# Patient Record
Sex: Male | Born: 1955 | Race: White | Hispanic: No
Health system: Southern US, Academic
[De-identification: ages and names within clinical notes are randomized; demographics above are authoritative.]

## PROBLEM LIST (undated history)

## (undated) ENCOUNTER — Encounter: Attending: Family | Primary: Family

## (undated) ENCOUNTER — Encounter: Attending: Hematology & Oncology | Primary: Hematology & Oncology

## (undated) ENCOUNTER — Ambulatory Visit: Payer: PRIVATE HEALTH INSURANCE | Attending: Family | Primary: Family

## (undated) ENCOUNTER — Telehealth: Attending: Family | Primary: Family

## (undated) ENCOUNTER — Encounter

## (undated) ENCOUNTER — Ambulatory Visit: Payer: PRIVATE HEALTH INSURANCE

## (undated) ENCOUNTER — Encounter: Attending: Physician Assistant | Primary: Physician Assistant

## (undated) ENCOUNTER — Ambulatory Visit

## (undated) ENCOUNTER — Telehealth

## (undated) ENCOUNTER — Telehealth: Attending: Hematology & Oncology | Primary: Hematology & Oncology

## (undated) ENCOUNTER — Telehealth: Attending: Physician Assistant | Primary: Physician Assistant

## (undated) ENCOUNTER — Ambulatory Visit: Payer: PRIVATE HEALTH INSURANCE | Attending: Hematology & Oncology | Primary: Hematology & Oncology

## (undated) ENCOUNTER — Telehealth: Attending: Urology | Primary: Urology

## (undated) ENCOUNTER — Ambulatory Visit: Payer: BLUE CROSS/BLUE SHIELD | Attending: Family | Primary: Family

## (undated) ENCOUNTER — Ambulatory Visit: Attending: Urology | Primary: Urology

## (undated) ENCOUNTER — Encounter: Payer: PRIVATE HEALTH INSURANCE | Attending: Urology | Primary: Urology

## (undated) DIAGNOSIS — K529 Noninfective gastroenteritis and colitis, unspecified: Secondary | ICD-10-CM

## (undated) DIAGNOSIS — R972 Elevated prostate specific antigen [PSA]: Secondary | ICD-10-CM

## (undated) DIAGNOSIS — Z8719 Personal history of other diseases of the digestive system: Secondary | ICD-10-CM

## (undated) DIAGNOSIS — Z8711 Personal history of peptic ulcer disease: Secondary | ICD-10-CM

## (undated) HISTORY — PX: BACK SURGERY: SHX140

## (undated) HISTORY — DX: Personal history of other diseases of the digestive system: Z87.19

## (undated) HISTORY — DX: Personal history of peptic ulcer disease: Z87.11

## (undated) HISTORY — DX: Noninfective gastroenteritis and colitis, unspecified: K52.9

## (undated) HISTORY — PX: TOTAL HIP ARTHROPLASTY: SHX124

## (undated) HISTORY — DX: Elevated prostate specific antigen (PSA): R97.20

## (undated) HISTORY — PX: COLONOSCOPY: SHX174

## (undated) MED ORDER — MELATONIN 3 MG TABLET: Freq: Every evening | ORAL | 0 days

## (undated) MED ORDER — OMEPRAZOLE 20 MG CAPSULE,DELAYED RELEASE: Freq: Every day | ORAL | 0 days | PRN

---

## 2004-10-01 ENCOUNTER — Ambulatory Visit: Payer: Self-pay | Admitting: Family Medicine

## 2013-11-03 ENCOUNTER — Ambulatory Visit: Payer: Self-pay | Admitting: Family Medicine

## 2014-02-28 ENCOUNTER — Ambulatory Visit: Payer: Self-pay | Admitting: Orthopedic Surgery

## 2014-02-28 LAB — BASIC METABOLIC PANEL
Anion Gap: 6 — ABNORMAL LOW (ref 7–16)
BUN: 21 mg/dL — ABNORMAL HIGH (ref 7–18)
CALCIUM: 9.5 mg/dL (ref 8.5–10.1)
CREATININE: 1.21 mg/dL (ref 0.60–1.30)
Chloride: 100 mmol/L (ref 98–107)
Co2: 30 mmol/L (ref 21–32)
EGFR (African American): >60
EGFR (Non-African Amer.): >60
GLUCOSE: 90 mg/dL (ref 65–99)
Osmolality: 274 (ref 275–301)
POTASSIUM: 4.2 mmol/L (ref 3.5–5.1)
SODIUM: 136 mmol/L (ref 136–145)

## 2014-02-28 LAB — URINALYSIS, COMPLETE
Bacteria: NONE SEEN
Bilirubin,UR: NEGATIVE
Glucose,UR: NEGATIVE mg/dL (ref 0–75)
Hyaline Cast: 10
Leukocyte Esterase: NEGATIVE
Nitrite: NEGATIVE
Ph: 5 (ref 4.5–8.0)
Protein: 30
RBC,UR: 13 /HPF (ref 0–5)
Specific Gravity: 1.027 (ref 1.003–1.030)
WBC UR: 2 /HPF (ref 0–5)

## 2014-02-28 LAB — CBC
HCT: 38.4 % — ABNORMAL LOW (ref 40.0–52.0)
HGB: 13.2 g/dL (ref 13.0–18.0)
MCH: 30.6 pg (ref 26.0–34.0)
MCHC: 34.4 g/dL (ref 32.0–36.0)
MCV: 89 fL (ref 80–100)
Platelet: 238 10*3/uL (ref 150–440)
RBC: 4.32 10*6/uL — AB (ref 4.40–5.90)
RDW: 13.2 % (ref 11.5–14.5)
WBC: 5.3 10*3/uL (ref 3.8–10.6)

## 2014-02-28 LAB — SEDIMENTATION RATE: Erythrocyte Sed Rate: 13 mm/hr (ref 0–20)

## 2014-02-28 LAB — PROTIME-INR
INR: 1.1
PROTHROMBIN TIME: 13.9 s (ref 11.5–14.7)

## 2014-02-28 LAB — MRSA PCR SCREENING

## 2014-02-28 LAB — APTT: ACTIVATED PTT: 34.4 s (ref 23.6–35.9)

## 2014-03-07 ENCOUNTER — Inpatient Hospital Stay: Payer: Self-pay | Admitting: Orthopedic Surgery

## 2014-03-08 LAB — BASIC METABOLIC PANEL
Anion Gap: 5 — ABNORMAL LOW (ref 7–16)
BUN: 12 mg/dL (ref 7–18)
CHLORIDE: 103 mmol/L (ref 98–107)
CO2: 28 mmol/L (ref 21–32)
CREATININE: 1.18 mg/dL (ref 0.60–1.30)
Calcium, Total: 8.4 mg/dL — ABNORMAL LOW (ref 8.5–10.1)
EGFR (African American): >60
GLUCOSE: 93 mg/dL (ref 65–99)
Osmolality: 271 (ref 275–301)
POTASSIUM: 4 mmol/L (ref 3.5–5.1)
Sodium: 136 mmol/L (ref 136–145)

## 2014-03-08 LAB — HEMOGLOBIN: HGB: 11.2 g/dL — AB (ref 13.0–18.0)

## 2014-03-08 LAB — PLATELET COUNT: Platelet: 185 10*3/uL (ref 150–440)

## 2014-03-09 LAB — PATHOLOGY REPORT

## 2014-12-25 DIAGNOSIS — K5190 Ulcerative colitis, unspecified, without complications: Secondary | ICD-10-CM | POA: Insufficient documentation

## 2015-01-22 ENCOUNTER — Ambulatory Visit: Payer: Self-pay | Admitting: Gastroenterology

## 2015-01-22 LAB — CLOSTRIDIUM DIFFICILE(ARMC)

## 2015-01-24 LAB — STOOL CULTURE

## 2015-02-05 ENCOUNTER — Ambulatory Visit: Payer: Self-pay | Admitting: Gastroenterology

## 2015-04-13 NOTE — Discharge Summary (Signed)
PATIENT NAME:  Michael Mccullough, HAMRE MR#:  [id number] DATE OF BIRTH:  [birth year]  DATE OF ADMISSION:  03/07/2014 DATE OF DISCHARGE:  03/09/2014  ADMITTING DIAGNOSIS: Right hip degenerative arthritis.   DISCHARGE DIAGNOSIS: Right hip degenerative arthritis.   OPERATION: On 03/07/2014, he had a right total hip arthroplasty.   SURGEON: Dr. Hessie Knows.   ANESTHESIA: Spinal.   ESTIMATED BLOOD LOSS: 200 mL.   DRAINS: Hemovac.   COMPLICATIONS: None.   IMPLANTS: Medacta Versa fit cup DM 54 mm, M28 head, with liner, 4 Amis stem.   HISTORY: Mr. Michael Mccullough is a 59 year old male, who failed conservative measures in treatment for his right hip osteoarthritis. He agreed to undergo a right total hip arthroplasty.   PHYSICAL EXAMINATION:  GENERAL: Well-developed, well-nourished. No apparent distress.  RESPIRATORY: No use of accessory muscles.  CARDIOVASCULAR: No lower extremity edema. No nausea or vomiting. Constipated. Tolerating diet. Incision well approximated. No drainage. Staples intact. Dressing clean and intact. Neurovascularly intact to right lower extremity. 3 over 5 straight leg raise, ankle dorsiflexion, and plantarflexion 5 over 5,  pain with hip flexion. Foley draining urine. TEDs and AVIs are on.   HOSPITAL COURSE: After initial admission on 03/07/2014, the patient underwent surgery the same day. He had good pain control afterwards and was transported to the orthopedic floor. On postoperative day 1, 03/08/2014, hemoglobin was 11.2. Physical therapy was begun on that day and he did progress with physical therapy. On postoperative day 2, 03/09/2014, he continued to progress well with physical therapy and did feel that he was able to be discharged on this day. Dressing was changed and his physical therapy goals were met.   DISPOSITION: The patient was sent home with home health physical therapy.   CONDITION AT DISCHARGE: Stable.   DISCHARGE INSTRUCTIONS: The patient will follow up in [facility] orthopedics in  2 weeks for staple removal. He will do physical therapy and weight bear as tolerated on the right lower extremity. A regular diet. TED hose knee-high bilaterally. Dressing can be changed once daily on an as-needed basis. Please see discharge instructions for a complete list of discharge medications.  ____________________________ Tajai Suder M. Tretha Sciara, NP amb:aw D: 03/28/2014 08:14:24 ET T: 03/28/2014 69:62:95 ET JOB#: [id number]  cc: Nasim Habeeb M. Tretha Sciara, NP, <Dictator> Kem Kays Raileigh Sabater FNP ELECTRONICALLY SIGNED 04/20/2014 14:34

## 2015-04-13 NOTE — Op Note (Signed)
PATIENT NAME:  Michael Mccullough, HUTMACHER MR#:  [id number] DATE OF BIRTH:  [birth year]  DATE OF PROCEDURE:  03/07/2014  PREOPERATIVE DIAGNOSIS: Right hip severe osteoarthritis.   POSTOPERATIVE DIAGNOSIS: Right hip severe osteoarthritis.   PROCEDURE: Right total hip replacement, anterior approach.   ANESTHESIA: Spinal.   SURGEON: Hessie Knows, M.D.   ASSISTANT: Rachelle Hora, PA-C.   DESCRIPTION OF PROCEDURE: The patient was brought to the operating room and after adequate anesthesia was obtained, the patient was placed on the operative table, left leg on a well-padded table, right leg in the traction boot. Preop x-ray was taken of the hip for templating subsequently after prepping and draping in the usual sterile fashion,  patient identification and timeout procedures were completed. Direct anterior approach was made with the incision centered over the greater trochanter. The tensor fascia muscle was identified and muscle belly opened and retracted laterally. Deep fascia was then incised and the lateral femoral circumflex vessels ligated. The anterior capsule was then opened and the femoral neck cut carried out with the head removed. The head showed findings of severe degenerative change with no cartilage and a mushroom appearance with osteophytes. The acetabulum also had sclerotic bone. The labrum was excised and sequential reaming was carried up to 54 mm where there was good bleeding bone. At this point, a 54 mm trial fit well and a 54 mm cup was impacted into place. Attention was then turned to the femur.   With the leg externally rotated and the leg dropped, sequential broaching was carried out up to a size 4. With a 4, trials were carried out and the final implant decision made. A size 4 Amis stem was impacted down the canal. It appeared to have a good proximal fill with a M28 mm head and the 54 mm Versafit cup DM liner. These components were assembled, impacted and the hip was reduced. There was good  stability to 90 degree external rotation test. The wound was thoroughly irrigated. The deep fascia was repaired using a heavy quill suture after infiltration of 30 mL of 0.25% Sensorcaine with epinephrine in the periarticular tissue. Aa subcutaneous drain was placed. A 2-0 quill was used subcutaneously, followed by skin staples. Xeroform, 4 x 4's, ABD and tape applied. The patient was sent to the recovery room in stable condition.   ESTIMATED BLOOD LOSS: 200 mL.   COMPLICATIONS: None.   SPECIMEN: Removed femoral head.   IMPLANTS: Medacta Amis stem size 4, 54 mm Versafit cup DM with associated liner and M28 mm head.   CONDITION: To recovery room, stable.    ____________________________ Laurene Footman, MD mjm:aw D: 03/07/2014 09:41:03 ET T: 03/07/2014 11:40:42 ET JOB#: [id number]  cc: Laurene Footman, MD, <Dictator> Laurene Footman MD ELECTRONICALLY SIGNED 03/07/2014 14:34

## 2015-04-15 LAB — SURGICAL PATHOLOGY

## 2015-08-30 ENCOUNTER — Ambulatory Visit (INDEPENDENT_AMBULATORY_CARE_PROVIDER_SITE_OTHER): Payer: BLUE CROSS/BLUE SHIELD | Admitting: Urology

## 2015-08-30 ENCOUNTER — Encounter: Payer: Self-pay | Admitting: Urology

## 2015-08-30 VITALS — BP 113/73 | HR 81 | Ht 67.0 in | Wt 185.6 lb

## 2015-08-30 DIAGNOSIS — R972 Elevated prostate specific antigen [PSA]: Secondary | ICD-10-CM

## 2015-08-30 DIAGNOSIS — N402 Nodular prostate without lower urinary tract symptoms: Secondary | ICD-10-CM | POA: Diagnosis not present

## 2015-08-30 MED ORDER — CIPROFLOXACIN HCL 500 MG PO TABS: 500.0000 mg | ORAL_TABLET | Freq: Two times a day (BID) | ORAL | Status: AC

## 2015-08-30 NOTE — Progress Notes (Signed)
08/30/2015 3:36 PM   Domenick Bookbinder 10/25/1956 [id number]  Referring provider: No referring provider defined for this encounter.  Chief Complaint  Patient presents with  . Elevated PSA    referred by Dr[facility] [place] psa 15.24    HPI: Mr Mossa is a 59yo with a hx of elevated PSA is seen in consultation today for an elevated PSA. He is b the [facility] for a recent PSA of 15.2. He was previously seen by urology several years ago for an elevated PSA and it was recommended he have a prostate biopsy. He never followed up and now presents today for evaluation. No hx of prostate infections.  He denies any LUTS, he denies hematuria.  No difficulty with erections His father and brother had prostate cancer and both are deceased. Neither died from prostate cancer.     PMH: Past Medical History  Diagnosis Date  . History of stomach ulcers   . Colitis   . Elevated PSA     Surgical History: Past Surgical History  Procedure Laterality Date  . Total hip arthroplasty Right   . Colonoscopy      Home Medications:    Medication List       This list is accurate as of: 08/30/15  3:36 PM.  Always use your most recent med list.               acetaminophen 325 MG tablet  Commonly known as:  TYLENOL  Take 650 mg by mouth every 6 (six) hours as needed.     Mesalamine 800 MG Tbec  Take by mouth.     omeprazole 20 MG capsule  Commonly known as:  PRILOSEC  TAKE 1 TABLET (20 MG TOTAL) BY MOUTH AS NEEDED.        Allergies:  Allergies  Allergen Reactions  . Aspirin Other (See Comments)    GI UPSET.    Family History: Family History  Problem Relation Age of Onset  . Kidney disease    . Prostate cancer Father   . Prostate cancer Brother     Social History:  reports that he has never smoked. He does not have any smokeless tobacco history on file. He reports that he drinks alcohol. He reports that he does not use illicit  drugs.  ROS: UROLOGY Frequent Urination?: No Hard to postpone urination?: No Burning/pain with urination?: No Get up at night to urinate?: No Leakage of urine?: No Urine stream starts and stops?: No Trouble starting stream?: No Do you have to strain to urinate?: No Blood in urine?: No Urinary tract infection?: No Sexually transmitted disease?: No Injury to kidneys or bladder?: No Painful intercourse?: No Weak stream?: No Erection problems?: No Penile pain?: No  Gastrointestinal Nausea?: No Vomiting?: No Indigestion/heartburn?: No Diarrhea?: No Constipation?: No  Constitutional Fever: No Night sweats?: No Weight loss?: No Fatigue?: No  Skin Skin rash/lesions?: No Itching?: No  Eyes Blurred vision?: No Double vision?: No  Ears/Nose/Throat Sore throat?: No Sinus problems?: No  Hematologic/Lymphatic Swollen glands?: No Easy bruising?: No  Cardiovascular Leg swelling?: No Chest pain?: No  Respiratory Cough?: No Shortness of breath?: No  Endocrine Excessive thirst?: No  Musculoskeletal Back pain?: Yes Joint pain?: Yes  Neurological Headaches?: No Dizziness?: No  Psychologic Depression?: No Anxiety?: No  Physical Exam: BP 113/73 mmHg  Pulse 81  Ht 5\' 7"  (1.702 m)  Wt 84.188 kg (185 lb 9.6 oz)  BMI 29.06 kg/m2  Constitutional:  Alert  and oriented, No acute distress. HEENT: [place] AT, moist mucus membranes.  Trachea midline, no masses. Cardiovascular: No clubbing, cyanosis, or edema. Respiratory: Normal respiratory effort, no increased work of breathing. GI: Abdomen is soft, nontender, nondistended, no abdominal masses GU: No CVA tenderness. No masses/lesions on penis, testis, scrotum. Prostate 30 g firm right assymetry. Perineum normal Skin: No rashes, bruises or suspicious lesions. Lymph: No cervical or inguinal adenopathy. Neurologic: Grossly intact, no focal deficits, moving all 4 extremities. Psychiatric: Normal mood and  affect.  Laboratory Data: Lab Results  Component Value Date   WBC 5.3 02/28/2014   HGB 11.2* 03/08/2014   HCT 38.4* 02/28/2014   MCV 89 02/28/2014   PLT 185 03/08/2014    Lab Results  Component Value Date   CREATININE 1.18 03/08/2014    No results found for: PSA  No results found for: TESTOSTERONE  No results found for: HGBA1C  Urinalysis    Component Value Date/Time   COLORURINE Amber 02/28/2014 0955   APPEARANCEUR Hazy 02/28/2014 0955   LABSPEC 1.027 02/28/2014 0955   PHURINE 5.0 02/28/2014 0955   GLUCOSEU Negative 02/28/2014 0955   HGBUR 1+ 02/28/2014 0955   BILIRUBINUR Negative 02/28/2014 0955   KETONESUR Trace 02/28/2014 0955   PROTEINUR 30 mg/dL 02/28/2014 0955   NITRITE Negative 02/28/2014 0955   LEUKOCYTESUR Negative 02/28/2014 0955     Assessment & Plan:   1. Elevated PSA,  nodular prostate  We discussed the possible etiologies of elevated PSA including benign conditions such as  BPH, inflammation, infection as well as malignant conditions most commonly  adenocarcinoma and re-iterated that elevated PSA did not equate to a cancer diagnosis.  We then discussed possible management strategies including prostate biopsy, recheck  after course of antibiotics, or serial PSA's (to establish clear trends, doubling time, etc..),  with attendant risks and benefits taking into consideration his overall health status, family  history, and life expectancy.  The patient has chosen to proceed with biopsy. We then discussed the risks (pain,  invasive procedure, bleeding, false positive/negative, serious / life-threatening infection)  and benefits (reassurance, better confirmation of diagnosis) as well as the fact that  additional biopsies or imaging may be recommended at a later time if future PSA values  are worrisome. We stressed the importance of adherence to the enemas and antibiotics as  prescribed as well as the possibility of day-of- procedure IM  antibiotic shot.  There are no diagnoses linked to this encounter.  No Follow-up on file.  Cleon Gustin, [facility] [address] [place], [place] [address] [phone]

## 2015-09-09 ENCOUNTER — Encounter: Payer: Self-pay | Admitting: *Deleted

## 2015-09-10 ENCOUNTER — Ambulatory Visit: Payer: BLUE CROSS/BLUE SHIELD | Admitting: Anesthesiology

## 2015-09-10 ENCOUNTER — Encounter
Admission: RE | Disposition: A | Discharge status: Home / Self Care | Payer: Self-pay | Source: Ambulatory Visit | Attending: Gastroenterology

## 2015-09-10 ENCOUNTER — Ambulatory Visit
Admission: RE | Admit: 2015-09-10 | Discharge: 2015-09-10 | Disposition: A | Discharge status: Home / Self Care | Payer: BLUE CROSS/BLUE SHIELD | Source: Ambulatory Visit | Attending: Gastroenterology | Admitting: Gastroenterology

## 2015-09-10 DIAGNOSIS — K5150 Left sided colitis without complications: Secondary | ICD-10-CM | POA: Diagnosis not present

## 2015-09-10 DIAGNOSIS — K648 Other hemorrhoids: Secondary | ICD-10-CM | POA: Insufficient documentation

## 2015-09-10 DIAGNOSIS — K5190 Ulcerative colitis, unspecified, without complications: Secondary | ICD-10-CM | POA: Diagnosis present

## 2015-09-10 DIAGNOSIS — Z886 Allergy status to analgesic agent status: Secondary | ICD-10-CM | POA: Diagnosis not present

## 2015-09-10 HISTORY — PX: COLONOSCOPY WITH PROPOFOL: SHX5780

## 2015-09-10 SURGERY — COLONOSCOPY WITH PROPOFOL
Anesthesia: General

## 2015-09-10 MED ORDER — SODIUM CHLORIDE 0.9 % IV SOLN
INTRAVENOUS | Status: DC
Start: 2015-09-10 — End: 2015-09-10

## 2015-09-10 MED ORDER — MIDAZOLAM HCL 5 MG/5ML IJ SOLN
INTRAMUSCULAR | Status: DC | PRN
  Administered 2015-09-10 (×2): 1 mg via INTRAVENOUS

## 2015-09-10 MED ORDER — SODIUM CHLORIDE 0.9 % IV SOLN
INTRAVENOUS | Status: DC
Start: 2015-09-10 — End: 2015-09-10
  Administered 2015-09-10: 16:00:00 via INTRAVENOUS
  Administered 2015-09-10: 1000 mL via INTRAVENOUS

## 2015-09-10 MED ORDER — FENTANYL CITRATE (PF) 100 MCG/2ML IJ SOLN
INTRAMUSCULAR | Status: DC | PRN
  Administered 2015-09-10: 50 ug via INTRAVENOUS

## 2015-09-10 MED ORDER — AMPICILLIN SODIUM 1 G IJ SOLR
1.0000 g | Freq: Once | INTRAMUSCULAR | Status: AC
  Administered 2015-09-10: 1 g via INTRAVENOUS
  Filled 2015-09-10: qty 1000

## 2015-09-10 MED ORDER — EPHEDRINE SULFATE 50 MG/ML IJ SOLN
INTRAMUSCULAR | Status: DC | PRN
  Administered 2015-09-10 (×2): 5 mg via INTRAVENOUS
  Administered 2015-09-10: 10 mg via INTRAVENOUS
  Administered 2015-09-10: 5 mg via INTRAVENOUS

## 2015-09-10 MED ORDER — LIDOCAINE HCL (CARDIAC) 20 MG/ML IV SOLN
INTRAVENOUS | Status: DC | PRN
  Administered 2015-09-10: 40 mg via INTRAVENOUS

## 2015-09-10 MED ORDER — PROPOFOL 10 MG/ML IV BOLUS
INTRAVENOUS | Status: DC | PRN
  Administered 2015-09-10: 40 mg via INTRAVENOUS

## 2015-09-10 MED ORDER — PROPOFOL INFUSION 10 MG/ML OPTIME
INTRAVENOUS | Status: DC | PRN
  Administered 2015-09-10: 140 ug/kg/min via INTRAVENOUS

## 2015-09-10 NOTE — Anesthesia Preprocedure Evaluation (Addendum)
Anesthesia Evaluation  Patient identified by MRN, date of birth, ID band Patient awake    Reviewed: Allergy & Precautions, NPO status , Patient's Chart, lab work & pertinent test results, reviewed documented beta blocker date and time   Airway Mallampati: II  TM Distance: >3 FB     Dental  (+) Chipped, Missing   Pulmonary           Cardiovascular      Neuro/Psych    GI/Hepatic   Endo/Other    Renal/GU      Musculoskeletal   Abdominal   Peds  Hematology   Anesthesia Other Findings Hx of ulcers.Several missing teeth.  Reproductive/Obstetrics                            Anesthesia Physical Anesthesia Plan  ASA: III  Anesthesia Plan: General   Post-op Pain Management:    Induction: Intravenous  Airway Management Planned: Nasal Cannula  Additional Equipment:   Intra-op Plan:   Post-operative Plan:   Informed Consent: I have reviewed the patients History and Physical, chart, labs and discussed the procedure including the risks, benefits and alternatives for the proposed anesthesia with the patient or authorized representative who has indicated his/her understanding and acceptance.     Plan Discussed with: CRNA  Anesthesia Plan Comments:         Anesthesia Quick Evaluation

## 2015-09-10 NOTE — Anesthesia Procedure Notes (Signed)
Date/Time: 09/10/2015 3:55 PM Performed by: Doreen Salvage Pre-anesthesia Checklist: Patient identified, Emergency Drugs available, Suction available and Patient being monitored Patient Re-evaluated:Patient Re-evaluated prior to inductionOxygen Delivery Method: Nasal cannula

## 2015-09-10 NOTE — H&P (Signed)
Outpatient short stay form Pre-procedure 09/10/2015 2:57 PM Lollie Sails MD  Primary Physician: Dr Sherrin Daisy  Reason for visit:  Colonoscopy  History of present illness:  Follow-up ulcerative colitis. Patient is a 59 year old male who last seen for colonoscopy in about February. That time showed evidence of a left-sided ulcerative colitis. As then he has been on a 5-ASA treatment and has been doing well. He has formed stools currently. No abdominal pain at this point.    Current facility-administered medications:  .  0.9 %  sodium chloride infusion, , Intravenous, Continuous, Lollie Sails, MD, Last Rate: 20 mL/hr at 09/10/15 1350, 1,000 mL at 09/10/15 1350 .  0.9 %  sodium chloride infusion, , Intravenous, Continuous, Lollie Sails, MD  Prescriptions prior to admission  Medication Sig Dispense Refill Last Dose  . acetaminophen (TYLENOL) 325 MG tablet Take 650 mg by mouth every 6 (six) hours as needed.     . ciprofloxacin (CIPRO) 500 MG tablet Take 1 tablet (500 mg total) by mouth 2 (two) times daily. 6 tablet 0   . Mesalamine 800 MG TBEC Take by mouth.   Taking  . omeprazole (PRILOSEC) 20 MG capsule TAKE 1 TABLET (20 MG TOTAL) BY MOUTH AS NEEDED.   Taking     Allergies  Allergen Reactions  . Aspirin Other (See Comments)    GI UPSET.     Past Medical History  Diagnosis Date  . History of stomach ulcers   . Colitis   . Elevated PSA     Review of systems:      Physical Exam    Heart and lungs: Regular rate and rhythm without rub or gallop, lungs are bilaterally clear.    HEENT: Norm cephalic atraumatic eyes are anicteric    Other:   Pertinant exam for procedure: Soft nontender nondistended bowel sounds positive normoactive    Planned proceedures: Colonoscopy and indicated procedures. I have discussed the risks benefits and complications of procedures to include not limited to bleeding, infection, perforation and the risk of sedation and the patient  wishes to proceed.    Lollie Sails, MD Gastroenterology 09/10/2015  2:57 PM

## 2015-09-10 NOTE — Op Note (Signed)
[facility] Gastroenterology Patient Name: Michael Mccullough Procedure Date: 09/10/2015 3:43 PM MRN: [id number] Account #: [id number] Date of Birth: 1956-03-25 Admit Type: Outpatient Age: 59 Room: [facility] ENDO ROOM 3 Gender: Male Note Status: Finalized Procedure:         Colonoscopy Indications:       Personal history of ulcerative colitis Providers:         Lollie Sails, MD Referring MD:      Shirline Frees (Referring MD) Medicines:         Monitored Anesthesia Care Complications:     No immediate complications. Procedure:         Pre-Anesthesia Assessment:                    - ASA Grade Assessment: III - A patient with severe                     systemic disease.                    After obtaining informed consent, the colonoscope was                     passed under direct vision. Throughout the procedure, the                     patient's blood pressure, pulse, and oxygen saturations                     were monitored continuously. The Colonoscope was                     introduced through the anus and advanced to the the cecum,                     identified by appendiceal orifice and ileocecal valve. The                     colonoscopy was performed without difficulty. The quality                     of the bowel preparation was good. Findings:      Diffuse mild inflammation characterized by congestion (edema), erythema       and friability was found in the rectum and in the sigmoid colon.      The exam was otherwise without abnormality.      The digital rectal exam was normal.      Non-bleeding internal hemorrhoids were found during anoscopy. The       hemorrhoids were small.      Biopsies for histology were taken with a cold forceps from the cecum,       ascending colon, transverse colon, descending colon, sigmoid colon and       rectum for evaluation of microscopic colitis.      a small mucosal plaque is seen in the mid rectum about 1/5 cm in size,      removed with a cold forcep and placed in a separate jar. Impression:        - Diffuse mild inflammation was found in the rectum and in                     the sigmoid colon secondary to proctosigmoid ulcerative  colitis.                    - The examination was otherwise normal.                    - Non-bleeding internal hemorrhoids.                    - Biopsies were taken with a cold forceps from the cecum,                     ascending colon, transverse colon, descending colon,                     sigmoid colon and rectum for evaluation of microscopic                     colitis. Recommendation:    - Await pathology results.                    - Use Asacol 800 mg PO TID.                    - Return to GI office in 4 weeks. 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 All rights reserved. The codes documented in this report are preliminary and upon coder review may  be revised to meet current compliance requirements. Lollie Sails, MD 09/10/2015 4:44:48 PM This report has been signed electronically. Number of Addenda: 0 Note Initiated On: 09/10/2015 3:43 PM Scope Withdrawal Time: 0 hours 20 minutes 56 seconds  Total Procedure Duration: 0 hours 30 minutes 47 seconds       [facility]

## 2015-09-10 NOTE — Transfer of Care (Signed)
Immediate Anesthesia Transfer of Care Note  Patient: Michael Mccullough  Procedure(s) Performed: Procedure(s): COLONOSCOPY WITH PROPOFOL (N/A)  Patient Location: PACU and Endoscopy Unit  Anesthesia Type:General  Level of Consciousness: sedated  Airway & Oxygen Therapy: Patient Spontanous Breathing and Patient connected to nasal cannula oxygen  Post-op Assessment: Report given to RN and Post -op Vital signs reviewed and stable  Post vital signs: Reviewed and stable  Last Vitals:  Filed Vitals:   09/10/15 1648  BP: 121/60  Pulse:   Temp: 36.1 C  Resp: 18    Complications: No apparent anesthesia complications

## 2015-09-11 ENCOUNTER — Encounter: Payer: Self-pay | Admitting: Gastroenterology

## 2015-09-11 NOTE — Anesthesia Postprocedure Evaluation (Signed)
  Anesthesia Post-op Note  Patient: Michael Mccullough  Procedure(s) Performed: Procedure(s): COLONOSCOPY WITH PROPOFOL (N/A)  Anesthesia type:General  Patient location: PACU  Post pain: Pain level controlled  Post assessment: Post-op Vital signs reviewed, Patient's Cardiovascular Status Stable, Respiratory Function Stable, Patent Airway and No signs of Nausea or vomiting  Post vital signs: Reviewed and stable  Last Vitals:  Filed Vitals:   09/10/15 1718  BP: 129/68  Pulse: 70  Temp:   Resp: 13    Level of consciousness: awake, alert  and patient cooperative  Complications: No apparent anesthesia complications

## 2015-09-12 LAB — SURGICAL PATHOLOGY

## 2015-09-23 ENCOUNTER — Other Ambulatory Visit: Payer: BLUE CROSS/BLUE SHIELD | Admitting: Urology

## 2015-09-23 ENCOUNTER — Encounter: Payer: Self-pay | Admitting: Urology

## 2015-10-01 ENCOUNTER — Ambulatory Visit: Payer: BLUE CROSS/BLUE SHIELD

## 2015-10-15 ENCOUNTER — Ambulatory Visit (INDEPENDENT_AMBULATORY_CARE_PROVIDER_SITE_OTHER): Payer: BLUE CROSS/BLUE SHIELD | Admitting: Urology

## 2015-10-15 ENCOUNTER — Other Ambulatory Visit: Payer: Self-pay | Admitting: Urology

## 2015-10-15 ENCOUNTER — Encounter: Payer: Self-pay | Admitting: Urology

## 2015-10-15 VITALS — BP 119/78 | HR 68 | Ht 67.0 in | Wt 178.2 lb

## 2015-10-15 DIAGNOSIS — R972 Elevated prostate specific antigen [PSA]: Secondary | ICD-10-CM | POA: Diagnosis not present

## 2015-10-15 MED ORDER — GENTAMICIN SULFATE 40 MG/ML IJ SOLN
80.0000 mg | Freq: Once | INTRAMUSCULAR | Status: AC
  Administered 2015-10-15: 80 mg via INTRAMUSCULAR

## 2015-10-15 MED ORDER — LEVOFLOXACIN 500 MG PO TABS: 500.0000 mg | ORAL_TABLET | Freq: Once | ORAL | Status: DC

## 2015-10-15 NOTE — Procedures (Signed)
Prostate Biopsy Procedure   Informed consent was obtained after discussing risks/benefits of the procedure.  A time out was performed to ensure correct patient identity.  Pre-Procedure: - Last PSA Level: No results found for: PSA - Gentamicin given prophylactically - Bactrim DS BID for three days peri-procedure -Transrectal Ultrasound performed revealing a 27.5 gm prostate -No significant hypoechoic or median lobe noted  Procedure: - Prostate block performed using 10 cc 1% lidocaine and biopsies taken from sextant areas, a total of 12 under ultrasound guidance.  Post-Procedure: - Patient tolerated the procedure well - He was counseled to seek immediate medical attention if experiences any severe pain, significant bleeding, or fevers - Return in one week to discuss biopsy results

## 2015-10-19 LAB — PATHOLOGY REPORT

## 2015-10-24 ENCOUNTER — Other Ambulatory Visit: Payer: Self-pay | Admitting: Urology

## 2015-10-24 ENCOUNTER — Telehealth: Payer: Self-pay

## 2015-10-24 ENCOUNTER — Ambulatory Visit (INDEPENDENT_AMBULATORY_CARE_PROVIDER_SITE_OTHER): Payer: BLUE CROSS/BLUE SHIELD | Admitting: Urology

## 2015-10-24 VITALS — BP 125/61 | HR 69 | Ht 67.0 in | Wt 181.9 lb

## 2015-10-24 DIAGNOSIS — C61 Malignant neoplasm of prostate: Secondary | ICD-10-CM

## 2015-10-24 NOTE — Telephone Encounter (Signed)
-----   Message from Ardis Hughs, MD sent at 10/23/2015  4:58 PM EDT ----- Regarding: Biopsy results Please call this patient and let him know that his prostate biopsy was negative.  Then, please schedule him for follow-up in 6 months with a PSA had of time. Thank you

## 2015-10-24 NOTE — Telephone Encounter (Signed)
LMOM

## 2015-10-24 NOTE — Telephone Encounter (Signed)
Pt has an appt today to get prostate bx results. Results will be given then.

## 2015-10-24 NOTE — Progress Notes (Signed)
10/24/2015 12:08 PM   Michael Mccullough Nov 19, 1956 [id number]  Referring provider: Sherrin Daisy, MD [place], [place] [address]  CC: Prostate Cancer  HPI: The patient returns to discuss his prostate biopsy results which were positive.  Also note, the patient has good incontinence and and has erections capable penetration.   PMH: Past Medical History  Diagnosis Date  . History of stomach ulcers   . Colitis   . Elevated PSA     Surgical History: Past Surgical History  Procedure Laterality Date  . Total hip arthroplasty Right   . Colonoscopy    . Back surgery    . Colonoscopy with propofol N/A 09/10/2015    Procedure: COLONOSCOPY WITH PROPOFOL;  Surgeon: Lollie Sails, MD;  Location: [facility] ENDOSCOPY;  Service: Endoscopy;  Laterality: N/A;    Home Medications:    Medication List       This list is accurate as of: 10/24/15 12:08 PM.  Always use your most recent med list.               acetaminophen 325 MG tablet  Commonly known as:  TYLENOL  Take 650 mg by mouth every 6 (six) hours as needed.     Mesalamine 800 MG Tbec  Take by mouth.     omeprazole 20 MG capsule  Commonly known as:  PRILOSEC  TAKE 1 TABLET (20 MG TOTAL) BY MOUTH AS NEEDED.        Allergies:  Allergies  Allergen Reactions  . Aspirin Other (See Comments)    GI UPSET.    Family History: Family History  Problem Relation Age of Onset  . Kidney disease    . Prostate cancer Father   . Prostate cancer Brother     Social History:  reports that he has never smoked. He does not have any smokeless tobacco history on file. He reports that he drinks alcohol. He reports that he does not use illicit drugs.  ROS:                                        Physical Exam: BP 125/61 mmHg  Pulse 69  Ht 5\' 7"  (1.702 m)  Wt 181 lb 14.4 oz (82.509 kg)  BMI 28.48 kg/m2  Constitutional:  Alert and oriented, No acute distress. HEENT: [place] AT, moist mucus  membranes.  Trachea midline, no masses. Cardiovascular: No clubbing, cyanosis, or edema. Respiratory: Normal respiratory effort, no increased work of breathing. GI: Abdomen is soft, nontender, nondistended, no abdominal masses GU: No CVA tenderness.  Skin: No rashes, bruises or suspicious lesions. Lymph: No cervical or inguinal adenopathy. Neurologic: Grossly intact, no focal deficits, moving all 4 extremities. Psychiatric: Normal mood and affect.  Laboratory Data: Lab Results  Component Value Date   WBC 5.3 02/28/2014   HGB 11.2* 03/08/2014   HCT 38.4* 02/28/2014   MCV 89 02/28/2014   PLT 185 03/08/2014    Lab Results  Component Value Date   CREATININE 1.18 03/08/2014    No results found for: PSA  No results found for: TESTOSTERONE  No results found for: HGBA1C  Urinalysis    Component Value Date/Time   COLORURINE Amber 02/28/2014 0955   APPEARANCEUR Hazy 02/28/2014 0955   LABSPEC 1.027 02/28/2014 0955   PHURINE 5.0 02/28/2014 0955   GLUCOSEU Negative 02/28/2014 0955   HGBUR 1+ 02/28/2014 0955   BILIRUBINUR Negative  02/28/2014 [place] 02/28/2014 0955   PROTEINUR 30 mg/dL 02/28/2014 0955   NITRITE Negative 02/28/2014 0955   LEUKOCYTESUR Negative 02/28/2014 0955    PSA: 15.24  Prostate biopsy: The patient has Gleason 3+3 = 6 prostate cancer in 8 of 12 cores. His entire right side is positive with 3 cores greater than 80% in additional core at 50%. His left lateral base and left mid also had bovine 3+3 = 6 cancer.   Assessment & Plan:  1. Prostate cancer The patient has high-volume Gleason 3+3 = 6 prostate cancer.  I had a long discussion with him regarding treatment options for him. We discussed watchful waiting, active surveillance, cryotherapy, radical prostatectomy, and radiation therapy. Due to his high volume disease and his age, I discussed with him that I don't think watchful waiting or active surveillance are great choices for him. We  discussed the role of robotic prostatectomy. He is aware that the risks include, but are not limited to, erectile dysfunction, impotence, incomplete cancer control, bleeding, infection, and repeat hospitalization. He is also aware that he would have to have a catheter postoperatively. We also discussed the role of radiation as a possible treatment modality and some of the risks of that approach. At this time he would like to think about his options. In the interim, we will have him get an opinion from the radiation oncologists. He'll follow-up after his radiation oncology appointment in a few weeks.   Return in about 3 weeks (around 11/14/2015).  Michael Retort, MD  [facility] [address], [place], [place] [address] [phone]

## 2015-10-30 ENCOUNTER — Ambulatory Visit: Payer: BLUE CROSS/BLUE SHIELD | Admitting: Radiation Oncology

## 2015-11-06 ENCOUNTER — Ambulatory Visit
Admission: RE | Admit: 2015-11-06 | Discharge: 2015-11-06 | Disposition: A | Discharge status: Home / Self Care | Payer: BLUE CROSS/BLUE SHIELD | Source: Ambulatory Visit | Attending: Radiation Oncology | Admitting: Radiation Oncology

## 2015-11-06 ENCOUNTER — Encounter: Payer: Self-pay | Admitting: Radiation Oncology

## 2015-11-06 ENCOUNTER — Encounter (INDEPENDENT_AMBULATORY_CARE_PROVIDER_SITE_OTHER): Payer: Self-pay

## 2015-11-06 VITALS — BP 110/64 | HR 84 | Temp 96.5°F | Resp 18 | Wt 170.6 lb

## 2015-11-06 DIAGNOSIS — C61 Malignant neoplasm of prostate: Secondary | ICD-10-CM

## 2015-11-06 NOTE — Consult Note (Signed)
Except an outstanding is perfect of Radiation Oncology NEW PATIENT EVALUATION  Name: Michael Mccullough  MRN: [id number]  Date:   11/06/2015     DOB: November 08, 1956   This 59 y.o. male patient presents to the clinic for initial evaluation of stage IIa prostate cancer Gleason 6 (3+3) bilateral disease presenting with a PSA in the 15 range.  REFERRING PHYSICIAN: Sherrin Daisy, MD  CHIEF COMPLAINT:  Chief Complaint  Patient presents with  . Prostate Cancer    Pt is here for initial consultation of prostate cancer.      DIAGNOSIS: The encounter diagnosis was Malignant neoplasm of prostate ([place]).   PREVIOUS INVESTIGATIONS:  Pathology report is reviewed Bone scan has been ordered Clinical notes reviewed  HPI: Patient is a 59 year old male presented with an elevated PSA in the 15 range. He was seen by urology underwent transrectal ultrasound-guided biopsies showing 8 of 12 biopsies positive for Gleason 6 (3+3) adenocarcinoma of the prostate. Patient is very little lower urinary tract symptoms. Nocturia 1 no urgency or frequency. He also has no erectile dysfunction and has good continence. He has been seen by urology and recommendations and discussion of radical prostatectomy were made. He seen today for radiation oncology opinion. He does have a history of total hip replacement and history of colitis.  PLANNED TREATMENT REGIMEN: I-125 interstitial implant  PAST MEDICAL HISTORY:  has a past medical history of History of stomach ulcers; Colitis; and Elevated PSA.    PAST SURGICAL HISTORY:  Past Surgical History  Procedure Laterality Date  . Total hip arthroplasty Right   . Colonoscopy    . Back surgery    . Colonoscopy with propofol N/A 09/10/2015    Procedure: COLONOSCOPY WITH PROPOFOL;  Surgeon: Lollie Sails, MD;  Location: [facility] ENDOSCOPY;  Service: Endoscopy;  Laterality: N/A;    FAMILY HISTORY: family history includes Kidney disease in an other family member; Prostate cancer in his  brother and father.  SOCIAL HISTORY:  reports that he has never smoked. He does not have any smokeless tobacco history on file. He reports that he drinks alcohol. He reports that he does not use illicit drugs.  ALLERGIES: Aspirin  MEDICATIONS:  Current Outpatient Prescriptions  Medication Sig Dispense Refill  . acetaminophen (TYLENOL) 325 MG tablet Take 650 mg by mouth every 6 (six) hours as needed.    . Mesalamine 800 MG TBEC Take by mouth.    Marland Kitchen omeprazole (PRILOSEC) 20 MG capsule TAKE 1 TABLET (20 MG TOTAL) BY MOUTH AS NEEDED.     No current facility-administered medications for this encounter.    ECOG PERFORMANCE STATUS:  0 - Asymptomatic  REVIEW OF SYSTEMS:  Patient denies any weight loss, fatigue, weakness, fever, chills or night sweats. Patient denies any loss of vision, blurred vision. Patient denies any ringing  of the ears or hearing loss. No irregular heartbeat. Patient denies heart murmur or history of fainting. Patient denies any chest pain or pain radiating to her upper extremities. Patient denies any shortness of breath, difficulty breathing at night, cough or hemoptysis. Patient denies any swelling in the lower legs. Patient denies any nausea vomiting, vomiting of blood, or coffee ground material in the vomitus. Patient denies any stomach pain. Patient states has had normal bowel movements no significant constipation or diarrhea. Patient denies any dysuria, hematuria or significant nocturia. Patient denies any problems walking, swelling in the joints or loss of balance. Patient denies any skin changes, loss of hair or loss of weight. Patient denies  any excessive worrying or anxiety or significant depression. Patient denies any problems with insomnia. Patient denies excessive thirst, polyuria, polydipsia. Patient denies any swollen glands, patient denies easy bruising or easy bleeding. Patient denies any recent infections, allergies or URI. Patient "s visual fields have not changed  significantly in recent time.    PHYSICAL EXAM: BP 110/64 mmHg  Pulse 84  Temp(Src) 96.5 F (35.8 C)  Resp 18  Wt 170 lb 10.2 oz (77.4 kg) On rectal exam rectal sphincter tone is good prostate is somewhat asymmetric with some firmness more prominent in the left lateral lobe sulcus is preserved bilaterally. No other rectal abnormalities identified. Well-developed well-nourished patient in NAD. HEENT reveals PERLA, EOMI, discs not visualized.  Oral cavity is clear. No oral mucosal lesions are identified. Neck is clear without evidence of cervical or supraclavicular adenopathy. Lungs are clear to A&P. Cardiac examination is essentially unremarkable with regular rate and rhythm without murmur rub or thrill. Abdomen is benign with no organomegaly or masses noted. Motor sensory and DTR levels are equal and symmetric in the upper and lower extremities. Cranial nerves II through XII are grossly intact. Proprioception is intact. No peripheral adenopathy or edema is identified. No motor or sensory levels are noted. Crude visual fields are within normal range.  LABORATORY DATA: Pathology reports reviewed    RADIOLOGY RESULTS: Bone scan has been ordered   IMPRESSION: Stage IIa (T1 CN 0 M0) adenocarcinoma the prostate bilateral disease presenting the PSA of 15 and Gleason 6 (3+3.   PLAN: At this time I got over treatment recommendations for the patient. I've also ordered a bone scan as a baseline since his PSA is over 10. I have run the [facility] nomogram showing only 29% chance of organ confined disease in a 69% and extracapsular extension. Lymph node involvement is minimal at 4%. He is concerned about erectile dysfunction as well as incontinence and is leaning towards radiation therapy option. I've explained both external beam treatment as well as I-125 interstitial implant. He is in favor of going ahead with outpatient implant. Risks and benefits of procedure including possible erectile  dysfunction, increasing lower urinary tract symptoms, diarrhea, fatigue, all were discussed in detail with the patient. We will set him up for volume study followed by I-125 interstitial implant.  I would like to take this opportunity for allowing me to participate in the care of your patient.Armstead Peaks., MD

## 2015-11-07 ENCOUNTER — Other Ambulatory Visit: Payer: Self-pay | Admitting: *Deleted

## 2015-11-07 ENCOUNTER — Encounter: Payer: Self-pay | Admitting: Urology

## 2015-11-07 ENCOUNTER — Ambulatory Visit (INDEPENDENT_AMBULATORY_CARE_PROVIDER_SITE_OTHER): Payer: BLUE CROSS/BLUE SHIELD | Admitting: Urology

## 2015-11-07 VITALS — BP 110/71 | HR 67 | Ht 67.0 in | Wt 170.1 lb

## 2015-11-07 DIAGNOSIS — C61 Malignant neoplasm of prostate: Secondary | ICD-10-CM | POA: Diagnosis not present

## 2015-11-07 NOTE — Progress Notes (Signed)
11/07/2015 3:29 PM   Michael Mccullough [birth year] [id number]  Referring provider: Sherrin Daisy, MD [place], [place] [id number]  Chief Complaint  Patient presents with  . Prostate Cancer    HPI:  58 year old male with newly diagnosed prostate cancer who presents to discuss treatment options after seeing [facility] oncology. After evaluating all his options, he has decided to undergo brachytherapy.  PSA: 15.24  Prostate biopsy: The patient has Gleason 3+3 = 6 prostate cancer in 8 of 12 cores. His entire right side is positive with 3 cores greater than 80% in additional core at 50%. His left lateral base and left mid also had bovine 3+3 = 6 cancer.  PMH: Past Medical History  Diagnosis Date  . History of stomach ulcers   . Colitis   . Elevated PSA     Surgical History: Past Surgical History  Procedure Laterality Date  . Total hip arthroplasty Right   . Colonoscopy    . Back surgery    . Colonoscopy with propofol N/A 09/10/2015    Procedure: COLONOSCOPY WITH PROPOFOL;  Surgeon: Lollie Sails, MD;  Location: [facility] ENDOSCOPY;  Service: Endoscopy;  Laterality: N/A;    Home Medications:    Medication List       This list is accurate as of: 11/07/15  3:29 PM.  Always use your most recent med list.               acetaminophen 325 MG tablet  Commonly known as:  TYLENOL  Take 650 mg by mouth every 6 (six) hours as needed.     Mesalamine 800 MG Tbec  Take by mouth.     omeprazole 20 MG capsule  Commonly known as:  PRILOSEC  TAKE 1 TABLET (20 MG TOTAL) BY MOUTH AS NEEDED.        Allergies:  Allergies  Allergen Reactions  . Aspirin Other (See Comments)    GI UPSET.    Family History: Family History  Problem Relation Age of Onset  . Kidney disease    . Prostate cancer Father   . Prostate cancer Brother     Social History:  reports that he has never smoked. He does not have any smokeless tobacco history on file. He reports that he drinks  alcohol. He reports that he does not use illicit drugs.  ROS: UROLOGY Frequent Urination?: Yes Hard to postpone urination?: No Burning/pain with urination?: No Get up at night to urinate?: No Leakage of urine?: No Urine stream starts and stops?: No Trouble starting stream?: No Do you have to strain to urinate?: No Blood in urine?: No Urinary tract infection?: No Sexually transmitted disease?: No Injury to kidneys or bladder?: No Painful intercourse?: No Weak stream?: No Erection problems?: No Penile pain?: No  Gastrointestinal Nausea?: No Vomiting?: No Indigestion/heartburn?: No Diarrhea?: No Constipation?: No  Constitutional Fever: No Night sweats?: No Weight loss?: No Fatigue?: No  Skin Skin rash/lesions?: No Itching?: No  Eyes Blurred vision?: No Double vision?: No  Ears/Nose/Throat Sore throat?: No Sinus problems?: No  Hematologic/Lymphatic Swollen glands?: No Easy bruising?: No  Cardiovascular Leg swelling?: No Chest pain?: No  Respiratory Cough?: No Shortness of breath?: No  Endocrine Excessive thirst?: No  Musculoskeletal Back pain?: No Joint pain?: No  Neurological Headaches?: No Dizziness?: No  Psychologic Depression?: No Anxiety?: No  Physical Exam: BP 110/71 mmHg  Pulse 67  Ht 5\' 7"  (1.702 m)  Wt 170 lb 1.6 oz (77.157 kg)  BMI 26.64 kg/m2  Constitutional:  Alert and oriented, No acute distress. HEENT: [place] AT, moist mucus membranes.  Trachea midline, no masses. Cardiovascular: No clubbing, cyanosis, or edema. Respiratory: Normal respiratory effort, no increased work of breathing. GI: Abdomen is soft, nontender, nondistended, no abdominal masses GU: No CVA tenderness.  Skin: No rashes, bruises or suspicious lesions. Lymph: No cervical or inguinal adenopathy. Neurologic: Grossly intact, no focal deficits, moving all 4 extremities. Psychiatric: Normal mood and affect.  Laboratory Data: Lab Results  Component Value Date     WBC 5.3 02/28/2014   HGB 11.2* 03/08/2014   HCT 38.4* 02/28/2014   MCV 89 02/28/2014   PLT 185 03/08/2014    Lab Results  Component Value Date   CREATININE 1.18 03/08/2014    No results found for: PSA  No results found for: TESTOSTERONE  No results found for: HGBA1C  Urinalysis    Component Value Date/Time   COLORURINE Amber 02/28/2014 0955   APPEARANCEUR Hazy 02/28/2014 0955   LABSPEC 1.027 02/28/2014 0955   PHURINE 5.0 02/28/2014 0955   GLUCOSEU Negative 02/28/2014 0955   HGBUR 1+ 02/28/2014 0955   BILIRUBINUR Negative 02/28/2014 0955   KETONESUR Trace 02/28/2014 0955   PROTEINUR 30 mg/dL 02/28/2014 0955   NITRITE Negative 02/28/2014 0955   LEUKOCYTESUR Negative 02/28/2014 0955     Assessment & Plan:    1. Prostate cancer The patient has elected to undergo brachytherapy. Urology will be available to assist in planning imaging as well as brachytherapy seed placement.   [name], MD  [facility] [address], [place], [place] [address] [phone]

## 2015-11-12 ENCOUNTER — Ambulatory Visit
Admission: RE | Admit: 2015-11-12 | Discharge: 2015-11-12 | Disposition: A | Discharge status: Home / Self Care | Payer: BLUE CROSS/BLUE SHIELD | Source: Ambulatory Visit | Attending: Radiation Oncology | Admitting: Radiation Oncology

## 2015-11-12 ENCOUNTER — Encounter
Admission: RE | Admit: 2015-11-12 | Discharge: 2015-11-12 | Disposition: A | Discharge status: Home / Self Care | Payer: BLUE CROSS/BLUE SHIELD | Source: Ambulatory Visit | Attending: Radiation Oncology | Admitting: Radiation Oncology

## 2015-11-12 DIAGNOSIS — C61 Malignant neoplasm of prostate: Secondary | ICD-10-CM | POA: Insufficient documentation

## 2015-11-12 MED ORDER — TECHNETIUM TC 99M MEDRONATE IV KIT
23.9500 | PACK | Freq: Once | INTRAVENOUS | Status: AC | PRN
  Administered 2015-11-12: 23.95 via INTRAVENOUS

## 2015-11-22 ENCOUNTER — Telehealth: Payer: Self-pay | Admitting: Radiology

## 2015-11-22 NOTE — Telephone Encounter (Signed)
LMOM to return call. Need to notify pt of surgery instructions.

## 2015-11-26 NOTE — Telephone Encounter (Signed)
Pt notified of surgery scheduled 12/24/15, pre-admit testing appt 12/13/15 @9 :45 and to call day before surgery for arrival time to SDS. Pt voices understanding.

## 2015-11-27 ENCOUNTER — Ambulatory Visit: Admission: RE | Admit: 2015-11-27 | Payer: BLUE CROSS/BLUE SHIELD | Source: Ambulatory Visit | Admitting: Urology

## 2015-11-27 ENCOUNTER — Encounter: Payer: Self-pay | Admitting: Radiation Oncology

## 2015-11-27 ENCOUNTER — Ambulatory Visit
Admission: RE | Admit: 2015-11-27 | Discharge: 2015-11-27 | Disposition: A | Discharge status: Home / Self Care | Payer: BLUE CROSS/BLUE SHIELD | Source: Ambulatory Visit | Attending: Radiation Oncology | Admitting: Radiation Oncology

## 2015-11-27 VITALS — BP 114/70 | HR 66 | Temp 96.8°F | Resp 18 | Ht 67.0 in | Wt 165.1 lb

## 2015-11-27 DIAGNOSIS — C61 Malignant neoplasm of prostate: Secondary | ICD-10-CM | POA: Insufficient documentation

## 2015-11-27 DIAGNOSIS — Z510 Encounter for antineoplastic radiation therapy: Secondary | ICD-10-CM | POA: Insufficient documentation

## 2015-11-27 SURGERY — ULTRASOUND, PROSTATE, FOR VOLUME DETERMINATION
Anesthesia: Choice

## 2015-11-27 NOTE — H&P (Signed)
Except an outstanding is perfect of Radiation Oncology NEW PATIENT EVALUATION  Name: Michael Mccullough  MRN: [id number]  Date:   11/27/2015     DOB: 08-03-1956   This 59 y.o. male patient presents to the clinic for initial evaluation of history and physical preop for I-125 interstitial implant.  REFERRING PHYSICIAN: Sherrin Daisy, MD  CHIEF COMPLAINT:  Chief Complaint  Patient presents with  . Prostate Cancer    f/u for H&P  prior to Seed Implant procedure    DIAGNOSIS: The encounter diagnosis was Malignant neoplasm of prostate ([place]).   PREVIOUS INVESTIGATIONS:  Pathology reports reviewed Clinical notes reviewed  HPI: Patient is a 59 year old male presented with an elevated PSA in the 15 range status post transrectal ultrasound-guided biopsy positive for Gleason 6 (3+3) adenocarcinoma the prostate. Patient opted for I-125 interstitial implant seen today for both history and physical and volume study. He is doing well does have erectile dysfunction. No voiding symptoms.  PLANNED TREATMENT REGIMEN: I-125 interstitial implant  PAST MEDICAL HISTORY:  has a past medical history of History of stomach ulcers; Colitis; and Elevated PSA.    PAST SURGICAL HISTORY:  Past Surgical History  Procedure Laterality Date  . Total hip arthroplasty Right   . Colonoscopy    . Back surgery    . Colonoscopy with propofol N/A 09/10/2015    Procedure: COLONOSCOPY WITH PROPOFOL;  Surgeon: Lollie Sails, MD;  Location: [facility] ENDOSCOPY;  Service: Endoscopy;  Laterality: N/A;    FAMILY HISTORY: family history includes Kidney disease in an other family member; Prostate cancer in his brother and father.  SOCIAL HISTORY:  reports that he has never smoked. He does not have any smokeless tobacco history on file. He reports that he drinks alcohol. He reports that he does not use illicit drugs.  ALLERGIES: Aspirin  MEDICATIONS:  Current Outpatient Prescriptions  Medication Sig Dispense Refill  .  acetaminophen (TYLENOL) 325 MG tablet Take 650 mg by mouth every 6 (six) hours as needed.    . Mesalamine 800 MG TBEC Take by mouth.    Marland Kitchen omeprazole (PRILOSEC) 20 MG capsule TAKE 1 TABLET (20 MG TOTAL) BY MOUTH AS NEEDED.     No current facility-administered medications for this encounter.    ECOG PERFORMANCE STATUS:  0 - Asymptomatic  REVIEW OF SYSTEMS:  Patient denies any weight loss, fatigue, weakness, fever, chills or night sweats. Patient denies any loss of vision, blurred vision. Patient denies any ringing  of the ears or hearing loss. No irregular heartbeat. Patient denies heart murmur or history of fainting. Patient denies any chest pain or pain radiating to her upper extremities. Patient denies any shortness of breath, difficulty breathing at night, cough or hemoptysis. Patient denies any swelling in the lower legs. Patient denies any nausea vomiting, vomiting of blood, or coffee ground material in the vomitus. Patient denies any stomach pain. Patient states has had normal bowel movements no significant constipation or diarrhea. Patient denies any dysuria, hematuria or significant nocturia. Patient denies any problems walking, swelling in the joints or loss of balance. Patient denies any skin changes, loss of hair or loss of weight. Patient denies any excessive worrying or anxiety or significant depression. Patient denies any problems with insomnia. Patient denies excessive thirst, polyuria, polydipsia. Patient denies any swollen glands, patient denies easy bruising or easy bleeding. Patient denies any recent infections, allergies or URI. Patient "s visual fields have not changed significantly in recent time.    PHYSICAL EXAM: BP 114/70 mmHg  Pulse 66  Temp(Src) 96.8 F (36 C)  Resp 18  Ht 5\' 7"  (1.702 m)  Wt 165 lb 2 oz (74.9 kg)  BMI 25.86 kg/m2 Well-developed well-nourished patient in NAD. HEENT reveals PERLA, EOMI, discs not visualized.  Oral cavity is clear. No oral mucosal  lesions are identified. Neck is clear without evidence of cervical or supraclavicular adenopathy. Lungs are clear to A&P. Cardiac examination is essentially unremarkable with regular rate and rhythm without murmur rub or thrill. Abdomen is benign with no organomegaly or masses noted. Motor sensory and DTR levels are equal and symmetric in the upper and lower extremities. Cranial nerves II through XII are grossly intact. Proprioception is intact. No peripheral adenopathy or edema is identified. No motor or sensory levels are noted. Crude visual fields are within normal range.  LABORATORY DATA: Pathology reports reviewed    RADIOLOGY RESULTS: Bone scan shows degenerative changes   IMPRESSION: Stage IIa adenocarcinoma the prostate in 59 year old male for I-125 interstitial implant  PLAN: At this time I'm study was performed. We will proceed with I-125 interstitial implant. Risks and benefits of procedure were reviewed with the patient and consent was signed. Side effects such as increased urinary frequency urgency and nocturia, possible diarrhea, and complications associated with surgery and radiation safety precautions all were reviewed with the patient. He seems to comprehend my treatment plan well. We have scheduled the implant. Will now proceed with BrachyVision treatment planning.  I would like to take this opportunity for allowing me to participate in the care of your patient.Armstead Peaks., MD

## 2015-11-27 NOTE — Progress Notes (Signed)
Radiation Oncology Follow up Note  Name: Michael Mccullough   Date:   11/27/2015 MRN:  [id number] DOB: May 20, 1956    This 59 y.o. male presents to the clinic today for volume study  REFERRING PROVIDER: Sherrin Daisy, MD  HPI: Patient is a 59 year old male presented with an elevated PSA in the 15 range underwent transrectal ultrasound-guided biopsy with 8 of 12 cores positive for Gleason 6 (3+3) adenocarcinoma. Patient is a history of erectile dysfunction no history of incontinence. He opted for I-125 interstitial implant is seen today for that. He is seen today and doing well..  COMPLICATIONS OF TREATMENT: none  FOLLOW UP COMPLIANCE: keeps appointments   PHYSICAL EXAM:  BP 114/70 mmHg  Pulse 66  Temp(Src) 96.8 F (36 C)  Resp 18  Ht 5\' 7"  (1.702 m)  Wt 165 lb 2 oz (74.9 kg)  BMI 25.86 kg/m2 Well-developed well-nourished patient in NAD. HEENT reveals PERLA, EOMI, discs not visualized.  Oral cavity is clear. No oral mucosal lesions are identified. Neck is clear without evidence of cervical or supraclavicular adenopathy. Lungs are clear to A&P. Cardiac examination is essentially unremarkable with regular rate and rhythm without murmur rub or thrill. Abdomen is benign with no organomegaly or masses noted. Motor sensory and DTR levels are equal and symmetric in the upper and lower extremities. Cranial nerves II through XII are grossly intact. Proprioception is intact. No peripheral adenopathy or edema is identified. No motor or sensory levels are noted. Crude visual fields are within normal range.  RADIOLOGY RESULTS: No films for review  PLAN: Patient was taken to the cystoscopy suite in the OR. Patient was placed in the low lithotomy position. Foley catheter was placed. Trans-rectal ultrasound probe was inserted into the rectum and prostate seminal vesicles were visualized as well as bladder base. stepping images were performed on a 5 mm increments. Images will be placed in BrachyVision  treatment planning system to determine seed placement coordinates for eventual I-125 interstitial implant. Images will be reviewed with the physics and dosimetry staff for final quality approval. I personally was present for the volume study and assisted in delineation of contour volumes.  At the end of the procedure Foley catheter was removed, rectal ultrasound probe was removed. Patient tolerated his procedures extremely well with no side effects or complaints. Patient has given appointment for interstitial implant date. Consent was signed today as well as history and physical performed in preparation for his outpatient surgical implant.      Armstead Peaks., MD

## 2015-11-27 NOTE — Op Note (Signed)
Date of procedure: 11/27/2015  Preoperative diagnosis:  1. Prostate cancer  Postoperative diagnosis:  1. Prostate cancer   Procedure: 1. Transrectal ultrasound 2. Volume study for upcoming brachytherapy seed implant  Surgeon: Hollice Espy, MD Dr. Lavena Stanford, radiation oncology  Anesthesia: General  Complications: None   Indication: Michael Mccullough is a 59 y.o. patient with newly diagnosed Gleason 3+3 prostate cancer involving 8 of 12 cores, PSA 15.2, TRUS volume 27.5 g.  all of his options were discussed and he is ultimately elected to undergo brachytherapy seed implant by Dr. Noreene Filbert.     Description of procedure:  The patient was taken to the operating room and placed in the dorsolithotomy position. Foley catheter was placed using standard sterile technique and the bladder was drained. A transrectal ultrasound was then introduced into the rectum and the prostate was carefully inspected via ultrasound. At this point in time, transverse images of the prostate were obtained and saved in 5 mm cuts starting at the base of the prostate down to the apex. These images were saved and will be used for the purpose of the volume study and brachytherapy seed implant planning.  Patient tolerated the procedure well. The ultrasound was then removed along with the Foley catheter. He was returned to the supine position and taken back to the cancer center in stable condition.  Hollice Espy, M.D.

## 2015-11-27 NOTE — Addendum Note (Signed)
Encounter addended by: Noreene Filbert, MD on: 11/27/2015 11:44 AM<BR>     Documentation filed: Clinical Notes

## 2015-11-30 IMAGING — NM NM BONE WHOLE BODY
2 series · 10 of 10 positions shown · non-contrast
Comparison: Bone scan of November 03, 2013.

CLINICAL DATA: Current history of prostate cancer. Left hip pain 1
month.

EXAM:
NUCLEAR MEDICINE WHOLE BODY BONE SCAN
TECHNIQUE: Whole body anterior and posterior images were obtained approximately
3 hours after intravenous injection of radiopharmaceutical.
RADIOPHARMACEUTICALS:  23.95 mCi Technetium-22m MDP IV

[Series 1000: statics · 2.40mm/px · 4 acquisitions, 8 frames shown]
[im 1/4]
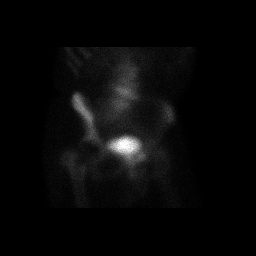
[im 1/4]
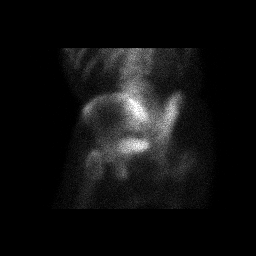
[im 2/4]
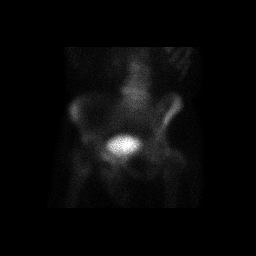
[im 2/4]
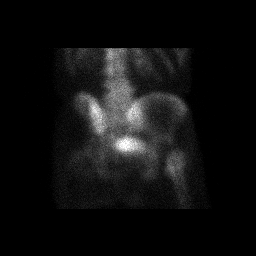
[im 3/4]
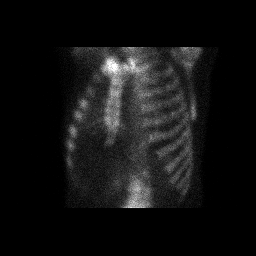
[im 3/4]
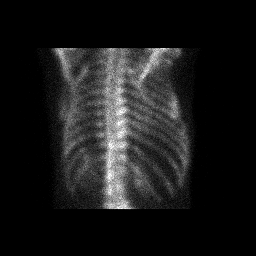
[im 4/4]
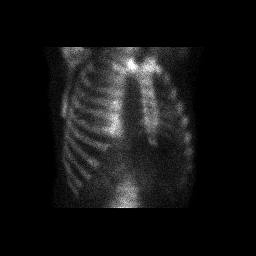
[im 4/4]
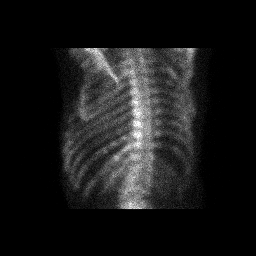

[Series 1000: 3 hr wholebody · 2.40mm/px · 2 of 2 frames shown]
[frame 1/2]
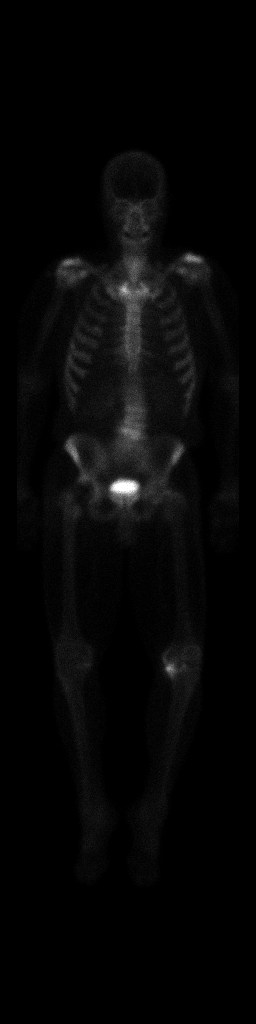
[frame 2/2]
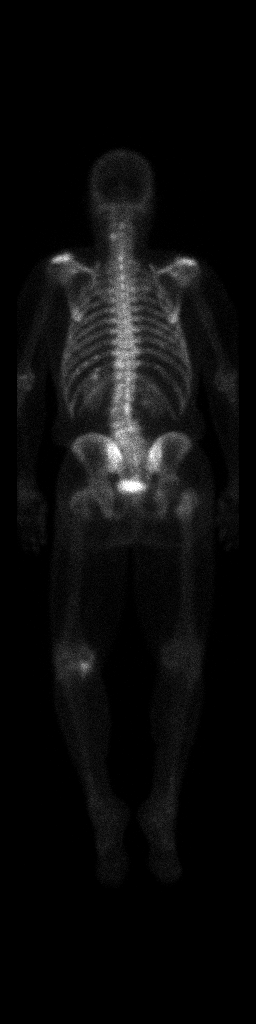

[10 of 10 positions shown; findings below may reference images not displayed]

FINDINGS: Abnormal uptake is noted in the left knee and lower lumbar spine
consistent with degenerative change. Abnormal uptake is also noted
in the right sternoclavicular joint consistent with degenerative
change. Several small foci of abnormal uptake are seen involving the
left posterior lower ribs which were present on prior exam and are
unchanged. There is noted a new focus of abnormal uptake seen
posteriorly in the left side of the cervical spine. Status post
right total hip arthroplasty.
IMPRESSION: Abnormal uptake is noted in the left knee, lower lumbar spine and
right sternoclavicular joint consistent with degenerative change.
Stable foci of abnormal uptake seen in the posterior portions of
left lower ribs most consistent old fractures.

There is a new small focus of abnormal uptake seen involving the
posterior portion on the left side of the mid cervical spine which
may simply represent degenerative change. Correlation with plain
radiographs is recommended for confirmation and to rule out
metastatic disease.

No other new areas of abnormal uptake are noted.

## 2015-12-03 DIAGNOSIS — C61 Malignant neoplasm of prostate: Secondary | ICD-10-CM | POA: Diagnosis not present

## 2015-12-13 ENCOUNTER — Encounter
Admission: RE | Admit: 2015-12-13 | Discharge: 2015-12-13 | Disposition: A | Discharge status: Home / Self Care | Payer: BLUE CROSS/BLUE SHIELD | Source: Ambulatory Visit | Attending: Urology | Admitting: Urology

## 2015-12-13 DIAGNOSIS — Z01812 Encounter for preprocedural laboratory examination: Secondary | ICD-10-CM | POA: Diagnosis not present

## 2015-12-13 LAB — URINALYSIS COMPLETE WITH MICROSCOPIC (ARMC ONLY)
BILIRUBIN URINE: NEGATIVE
Bacteria, UA: NONE SEEN
GLUCOSE, UA: NEGATIVE mg/dL
KETONES UR: NEGATIVE mg/dL
LEUKOCYTES UA: NEGATIVE
NITRITE: NEGATIVE
PH: 5 (ref 5.0–8.0)
Protein, ur: NEGATIVE mg/dL
SPECIFIC GRAVITY, URINE: 1.02 (ref 1.005–1.030)
Squamous Epithelial / LPF: NONE SEEN

## 2015-12-13 NOTE — Patient Instructions (Addendum)
  Your procedure is scheduled on: 12/24/15 Tues  Report to Day Surgery.2nd floor medical mall To find out your arrival time please call [phone] between 1PM - 3PM on 12/22/14 Mon.  Remember: Instructions that are not followed completely may result in serious medical risk, up to and including death, or upon the discretion of your surgeon and anesthesiologist your surgery may need to be rescheduled.    _x___ 1. Do not eat food or drink liquids after midnight. No gum chewing or hard candies.     _x___ 2. No Alcohol for 24 hours before or after surgery.   ____ 3. Bring all medications with you on the day of surgery if instructed.    x____ 4. Notify your doctor if there is any change in your medical condition     (cold, fever, infections).     Do not wear jewelry, make-up, hairpins, clips or nail polish.  Do not wear lotions, powders, or perfumes. You may wear deodorant.  Do not shave 48 hours prior to surgery. Men may shave face and neck.  Do not bring valuables to the hospital.    [facility] is not responsible for any belongings or valuables.               Contacts, dentures or bridgework may not be worn into surgery.  Leave your suitcase in the car. After surgery it may be brought to your room.  For patients admitted to the hospital, discharge time is determined by your                treatment team.   Patients discharged the day of surgery will not be allowed to drive home.   Please read over the following fact sheets that you were given:      __x__ Take these medicines the morning of surgery with A SIP OF WATER:    1. omeprazole (PRILOSEC) 20 MG capsule  2.   3.   4.  5.  6.  ____ Fleet Enema (as directed)   ____ Use CHG Soap as directed  ____ Use inhalers on the day of surgery  ____ Stop metformin 2 days prior to surgery    ____ Take 1/2 of usual insulin dose the night before surgery and none on the morning of surgery.   ____ Stop Coumadin/Plavix/aspirin on    ____ Stop Anti-inflammatories on    ____ Stop supplements until after surgery.    ____ Bring C-Pap to the hospital.

## 2015-12-15 LAB — URINE CULTURE: Culture: NO GROWTH

## 2015-12-18 ENCOUNTER — Other Ambulatory Visit: Payer: BLUE CROSS/BLUE SHIELD

## 2015-12-24 ENCOUNTER — Encounter: Payer: Self-pay | Admitting: Anesthesiology

## 2015-12-24 ENCOUNTER — Ambulatory Visit
Admission: RE | Admit: 2015-12-24 | Discharge: 2015-12-24 | Disposition: A | Discharge status: Home / Self Care | Payer: BLUE CROSS/BLUE SHIELD | Source: Ambulatory Visit | Admitting: Radiation Oncology

## 2015-12-24 ENCOUNTER — Encounter
Admission: RE | Disposition: A | Discharge status: Home / Self Care | Payer: Self-pay | Source: Ambulatory Visit | Attending: Urology

## 2015-12-24 ENCOUNTER — Ambulatory Visit: Payer: BLUE CROSS/BLUE SHIELD | Admitting: Anesthesiology

## 2015-12-24 ENCOUNTER — Ambulatory Visit
Admission: RE | Admit: 2015-12-24 | Discharge: 2015-12-24 | Disposition: A | Discharge status: Home / Self Care | Payer: BLUE CROSS/BLUE SHIELD | Source: Ambulatory Visit | Attending: Urology | Admitting: Urology

## 2015-12-24 DIAGNOSIS — Z79899 Other long term (current) drug therapy: Secondary | ICD-10-CM | POA: Insufficient documentation

## 2015-12-24 DIAGNOSIS — N529 Male erectile dysfunction, unspecified: Secondary | ICD-10-CM | POA: Insufficient documentation

## 2015-12-24 DIAGNOSIS — C61 Malignant neoplasm of prostate: Secondary | ICD-10-CM | POA: Diagnosis not present

## 2015-12-24 DIAGNOSIS — Z8719 Personal history of other diseases of the digestive system: Secondary | ICD-10-CM | POA: Diagnosis not present

## 2015-12-24 DIAGNOSIS — Z96641 Presence of right artificial hip joint: Secondary | ICD-10-CM | POA: Diagnosis not present

## 2015-12-24 DIAGNOSIS — Z9889 Other specified postprocedural states: Secondary | ICD-10-CM | POA: Diagnosis not present

## 2015-12-24 HISTORY — PX: RADIOACTIVE SEED IMPLANT: SHX5150

## 2015-12-24 HISTORY — PX: CYSTOSCOPY: SHX5120

## 2015-12-24 SURGERY — INSERTION, RADIATION SOURCE, PROSTATE
Anesthesia: General | Wound class: Clean Contaminated

## 2015-12-24 MED ORDER — PROPOFOL 10 MG/ML IV BOLUS
INTRAVENOUS | Status: DC | PRN
  Administered 2015-12-24: 150 mg via INTRAVENOUS

## 2015-12-24 MED ORDER — ONDANSETRON HCL 4 MG/2ML IJ SOLN
INTRAMUSCULAR | Status: DC | PRN
  Administered 2015-12-24: 4 mg via INTRAVENOUS

## 2015-12-24 MED ORDER — HYDROCODONE-ACETAMINOPHEN 5-325 MG PO TABS: 1.0000 | ORAL_TABLET | Freq: Four times a day (QID) | ORAL | Status: AC | PRN

## 2015-12-24 MED ORDER — FENTANYL CITRATE (PF) 100 MCG/2ML IJ SOLN: 25.0000 ug | INTRAMUSCULAR | Status: DC | PRN

## 2015-12-24 MED ORDER — SODIUM CHLORIDE 0.9 % IV SOLN
1.0000 g | Freq: Once | INTRAVENOUS | Status: AC
  Administered 2015-12-24: 1 g via INTRAVENOUS

## 2015-12-24 MED ORDER — OXYCODONE HCL 5 MG PO TABS: 5.0000 mg | ORAL_TABLET | Freq: Once | ORAL | Status: DC | PRN

## 2015-12-24 MED ORDER — DEXAMETHASONE SODIUM PHOSPHATE 10 MG/ML IJ SOLN
INTRAMUSCULAR | Status: DC | PRN
  Administered 2015-12-24: 4 mg via INTRAVENOUS

## 2015-12-24 MED ORDER — GENTAMICIN IN SALINE 1.6-0.9 MG/ML-% IV SOLN
80.0000 mg | Freq: Once | INTRAVENOUS | Status: AC
  Administered 2015-12-24: 80 mg via INTRAVENOUS
  Filled 2015-12-24: qty 50

## 2015-12-24 MED ORDER — OXYCODONE HCL 5 MG/5ML PO SOLN: 5.0000 mg | Freq: Once | ORAL | Status: DC | PRN

## 2015-12-24 MED ORDER — PHENYLEPHRINE HCL 10 MG/ML IJ SOLN
INTRAMUSCULAR | Status: DC | PRN
  Administered 2015-12-24: 100 ug via INTRAVENOUS

## 2015-12-24 MED ORDER — EPHEDRINE SULFATE 50 MG/ML IJ SOLN
INTRAMUSCULAR | Status: DC | PRN
  Administered 2015-12-24 (×3): 10 mg via INTRAVENOUS

## 2015-12-24 MED ORDER — LIDOCAINE HCL (CARDIAC) 20 MG/ML IV SOLN
INTRAVENOUS | Status: DC | PRN
  Administered 2015-12-24: 80 mg via INTRAVENOUS

## 2015-12-24 MED ORDER — TAMSULOSIN HCL 0.4 MG PO CAPS: 0.4000 mg | ORAL_CAPSULE | Freq: Every day | ORAL | Status: AC

## 2015-12-24 MED ORDER — FENTANYL CITRATE (PF) 100 MCG/2ML IJ SOLN
INTRAMUSCULAR | Status: DC | PRN
  Administered 2015-12-24: 50 ug via INTRAVENOUS

## 2015-12-24 MED ORDER — DOCUSATE SODIUM 100 MG PO CAPS: 100.0000 mg | ORAL_CAPSULE | Freq: Two times a day (BID) | ORAL | Status: AC

## 2015-12-24 MED ORDER — LACTATED RINGERS IV SOLN
INTRAVENOUS | Status: DC
  Administered 2015-12-24: 07:00:00 via INTRAVENOUS

## 2015-12-24 MED ORDER — CIPROFLOXACIN HCL 500 MG PO TABS: 500.0000 mg | ORAL_TABLET | Freq: Two times a day (BID) | ORAL | Status: AC

## 2015-12-24 MED ORDER — GENTAMICIN SULFATE 40 MG/ML IJ SOLN
80.0000 mg | Freq: Once | INTRAVENOUS | Status: DC
  Filled 2015-12-24: qty 2

## 2015-12-24 MED ORDER — MIDAZOLAM HCL 5 MG/5ML IJ SOLN
INTRAMUSCULAR | Status: DC | PRN
  Administered 2015-12-24: 2 mg via INTRAVENOUS

## 2015-12-24 MED ORDER — BACITRACIN ZINC 500 UNIT/GM EX OINT
TOPICAL_OINTMENT | CUTANEOUS | Status: AC
  Filled 2015-12-24: qty 28.35

## 2015-12-24 MED ORDER — SODIUM CHLORIDE 0.9 % IV SOLN
INTRAVENOUS | Status: AC
  Filled 2015-12-24: qty 1000

## 2015-12-24 SURGICAL SUPPLY — 29 items
BAG URO DRAIN 2000ML W/SPOUT (MISCELLANEOUS) ×3 IMPLANT
BLADE CLIPPER SURG (BLADE) ×3 IMPLANT
CATH FOL 2WAY LX 16X5 (CATHETERS) ×3 IMPLANT
CATH ROBINSON RED A/P 16FR (CATHETERS) IMPLANT
DRAPE INCISE 23X17 IOBAN STRL (DRAPES) ×2
DRAPE INCISE IOBAN 23X17 STRL (DRAPES) ×1 IMPLANT
DRAPE TABLE BACK 80X90 (DRAPES) ×3 IMPLANT
DRAPE UNDER BUTTOCK W/FLU (DRAPES) ×3 IMPLANT
GLOVE BIO SURGEON STRL SZ 6.5 (GLOVE) ×4 IMPLANT
GLOVE BIO SURGEON STRL SZ7 (GLOVE) ×6 IMPLANT
GLOVE BIO SURGEON STRL SZ7.5 (GLOVE) ×6 IMPLANT
GLOVE BIO SURGEONS STRL SZ 6.5 (GLOVE) ×2
GOWN STRL REUS W/ TWL LRG LVL3 (GOWN DISPOSABLE) ×2 IMPLANT
GOWN STRL REUS W/ TWL XL LVL3 (GOWN DISPOSABLE) ×1 IMPLANT
GOWN STRL REUS W/TWL LRG LVL3 (GOWN DISPOSABLE) ×4
GOWN STRL REUS W/TWL XL LVL3 (GOWN DISPOSABLE) ×2
GRADUATE 1200CC STRL 31836 (MISCELLANEOUS) ×3 IMPLANT
IV NS 1000ML (IV SOLUTION) ×2
IV NS 1000ML BAXH (IV SOLUTION) ×1 IMPLANT
KIT RM TURNOVER CYSTO AR (KITS) ×3 IMPLANT
PACK CYSTO AR (MISCELLANEOUS) ×3 IMPLANT
PAD PREP 24X41 OB/GYN DISP (PERSONAL CARE ITEMS) ×3 IMPLANT
SET CYSTO W/LG BORE CLAMP LF (SET/KITS/TRAYS/PACK) ×3 IMPLANT
SOL PREP PVP 2OZ (MISCELLANEOUS) ×3
SOLUTION PREP PVP 2OZ (MISCELLANEOUS) ×1 IMPLANT
SURGILUBE 2OZ TUBE FLIPTOP (MISCELLANEOUS) ×3 IMPLANT
SYRINGE IRR TOOMEY STRL 70CC (SYRINGE) ×3 IMPLANT
WATER STERILE IRR 1000ML POUR (IV SOLUTION) ×3 IMPLANT
WATER STERILE IRR 3000ML UROMA (IV SOLUTION) ×3 IMPLANT

## 2015-12-24 NOTE — Transfer of Care (Signed)
Immediate Anesthesia Transfer of Care Note  Patient: Michael Mccullough  Procedure(s) Performed: Procedure(s): RADIOACTIVE SEED IMPLANT/BRACHYTHERAPY IMPLANT (N/A) CYSTOSCOPY (N/A)  Patient Location: PACU  Anesthesia Type:General  Level of Consciousness: sedated  Airway & Oxygen Therapy: Patient Spontanous Breathing and Patient connected to face mask oxygen  Post-op Assessment: Report given to RN and Post -op Vital signs reviewed and stable  Post vital signs: Reviewed and stable  Last Vitals:  Filed Vitals:   12/24/15 0617 12/24/15 0827  BP: 115/73 92/51  Pulse: 68 76  Temp: 35.7 C 36.4 C  Resp: 14 15    Complications: No apparent anesthesia complications

## 2015-12-24 NOTE — Interval H&P Note (Signed)
History and Physical Interval Note:  12/24/2015 7:25 AM  Michael Mccullough  has presented today for surgery, with the diagnosis of PROSTATE CANCER  The various methods of treatment have been discussed with the patient and family. After consideration of risks, benefits and other options for treatment, the patient has consented to  Procedure(s): RADIOACTIVE SEED IMPLANT/BRACHYTHERAPY IMPLANT (N/A) as a surgical intervention .  The patient's history has been reviewed, patient examined, no change in status, stable for surgery.  I have reviewed the patient's chart and labs.  Questions were answered to the patient's satisfaction.     Hollice Espy

## 2015-12-24 NOTE — H&P (View-Only) (Signed)
Except an outstanding is perfect of Radiation Oncology NEW PATIENT EVALUATION  Name: Michael Mccullough  MRN: [id number]  Date:   11/27/2015     DOB: 01-13-1956   This 60 y.o. male patient presents to the clinic for initial evaluation of history and physical preop for I-125 interstitial implant.  REFERRING PHYSICIAN: Sherrin Daisy, MD  CHIEF COMPLAINT:  Chief Complaint  Patient presents with  . Prostate Cancer    f/u for H&P  prior to Seed Implant procedure    DIAGNOSIS: The encounter diagnosis was Malignant neoplasm of prostate ([place]).   PREVIOUS INVESTIGATIONS:  Pathology reports reviewed Clinical notes reviewed  HPI: Patient is a 60 year old male presented with an elevated PSA in the 15 range status post transrectal ultrasound-guided biopsy positive for Gleason 6 (3+3) adenocarcinoma the prostate. Patient opted for I-125 interstitial implant seen today for both history and physical and volume study. He is doing well does have erectile dysfunction. No voiding symptoms.  PLANNED TREATMENT REGIMEN: I-125 interstitial implant  PAST MEDICAL HISTORY:  has a past medical history of History of stomach ulcers; Colitis; and Elevated PSA.    PAST SURGICAL HISTORY:  Past Surgical History  Procedure Laterality Date  . Total hip arthroplasty Right   . Colonoscopy    . Back surgery    . Colonoscopy with propofol N/A 09/10/2015    Procedure: COLONOSCOPY WITH PROPOFOL;  Surgeon: Lollie Sails, MD;  Location[facility] ENDOSCOPY;  Service: Endoscopy;  Laterality: N/A;    FAMILY HISTORY: family history includes Kidney disease in an other family member; Prostate cancer in his brother and father.  SOCIAL HISTORY:  reports that he has never smoked. He does not have any smokeless tobacco history on file. He reports that he drinks alcohol. He reports that he does not use illicit drugs.  ALLERGIES: Aspirin  MEDICATIONS:  Current Outpatient Prescriptions  Medication Sig Dispense Refill  .  acetaminophen (TYLENOL) 325 MG tablet Take 650 mg by mouth every 6 (six) hours as needed.    . Mesalamine 800 MG TBEC Take by mouth.    Marland Kitchen omeprazole (PRILOSEC) 20 MG capsule TAKE 1 TABLET (20 MG TOTAL) BY MOUTH AS NEEDED.     No current facility-administered medications for this encounter.    ECOG PERFORMANCE STATUS:  0 - Asymptomatic  REVIEW OF SYSTEMS:  Patient denies any weight loss, fatigue, weakness, fever, chills or night sweats. Patient denies any loss of vision, blurred vision. Patient denies any ringing  of the ears or hearing loss. No irregular heartbeat. Patient denies heart murmur or history of fainting. Patient denies any chest pain or pain radiating to her upper extremities. Patient denies any shortness of breath, difficulty breathing at night, cough or hemoptysis. Patient denies any swelling in the lower legs. Patient denies any nausea vomiting, vomiting of blood, or coffee ground material in the vomitus. Patient denies any stomach pain. Patient states has had normal bowel movements no significant constipation or diarrhea. Patient denies any dysuria, hematuria or significant nocturia. Patient denies any problems walking, swelling in the joints or loss of balance. Patient denies any skin changes, loss of hair or loss of weight. Patient denies any excessive worrying or anxiety or significant depression. Patient denies any problems with insomnia. Patient denies excessive thirst, polyuria, polydipsia. Patient denies any swollen glands, patient denies easy bruising or easy bleeding. Patient denies any recent infections, allergies or URI. Patient "s visual fields have not changed significantly in recent time.    PHYSICAL EXAM: BP 114/70 mmHg  Pulse 66  Temp(Src) 96.8 F (36 C)  Resp 18  Ht 5\' 7"  (1.702 m)  Wt 165 lb 2 oz (74.9 kg)  BMI 25.86 kg/m2 Well-developed well-nourished patient in NAD. HEENT reveals PERLA, EOMI, discs not visualized.  Oral cavity is clear. No oral mucosal  lesions are identified. Neck is clear without evidence of cervical or supraclavicular adenopathy. Lungs are clear to A&P. Cardiac examination is essentially unremarkable with regular rate and rhythm without murmur rub or thrill. Abdomen is benign with no organomegaly or masses noted. Motor sensory and DTR levels are equal and symmetric in the upper and lower extremities. Cranial nerves II through XII are grossly intact. Proprioception is intact. No peripheral adenopathy or edema is identified. No motor or sensory levels are noted. Crude visual fields are within normal range.  LABORATORY DATA: Pathology reports reviewed    RADIOLOGY RESULTS: Bone scan shows degenerative changes   IMPRESSION: Stage IIa adenocarcinoma the prostate in 60 year old male for I-125 interstitial implant  PLAN: At this time I'm study was performed. We will proceed with I-125 interstitial implant. Risks and benefits of procedure were reviewed with the patient and consent was signed. Side effects such as increased urinary frequency urgency and nocturia, possible diarrhea, and complications associated with surgery and radiation safety precautions all were reviewed with the patient. He seems to comprehend my treatment plan well. We have scheduled the implant. Will now proceed with BrachyVision treatment planning.  I would like to take this opportunity for allowing me to participate in the care of your patient.Armstead Peaks., MD

## 2015-12-24 NOTE — Discharge Instructions (Signed)
Brachytherapy for Prostate Cancer, Care After Refer to this sheet in the next few weeks. These instructions provide you with information on caring for yourself after your procedure. Your health care provider may also give you more specific instructions. Your treatment has been planned according to current medical practices, but problems sometimes occur. Call your health care provider if you have any problems or questions after your procedure. WHAT TO EXPECT AFTER THE PROCEDURE The area behind the scrotum will probably be tender and bruised. For a short period of time you may have:  Difficulty passing urine. You may need a catheter for a few days to a month.  Blood in the urine or semen.  A feeling of constipation because of prostate swelling.  Frequent feeling of an urgent need to urinate. For a long period of time you may have:  Inflammation of the rectum. This happens in about 2% of people who have the procedure.  Erection problems. These vary with age and occur in about 15-40% of men.  Difficulty urinating. This is caused by scarring in the urethra.  Diarrhea. HOME CARE INSTRUCTIONS   Take medicines only as directed by your health care provider.  You will probably have a catheter in your bladder for several days. You will have blood in the urine bag and should drink a lot of fluids to keep it a light red color.  Keep all follow-up visits as directed by your health care provider. If you have a catheter, it will be removed during one of these visits.  Try not to sit directly on the area behind the scrotum. A soft cushion can decrease the discomfort. Ice packs may also be helpful for the discomfort. Do not put ice directly on the skin.  Shower and wash the area behind the scrotum gently. Do not sit in a tub.  If you have had the brachytherapy that uses the seeds, limit your close contact with children and pregnant women for 2 months because of the radiation still in the prostate.  After that period of time, the levels drop off quickly. SEEK IMMEDIATE MEDICAL CARE IF:   You have a fever.  You have chills.  You have shortness of breath.  You have chest pain.  You have thick blood, like tomato juice, in the urine bag.  Your catheter is blocked so urine cannot get into the bag. Your bladder area or lower abdomen may be swollen.  There is excessive bleeding from your rectum. It is normal to have a little blood mixed with your stool.  There is severe discomfort in the treated area that does not go away with pain medicine.  You have abdominal discomfort.  You have severe nausea or vomiting.  You develop any new or unusual symptoms.   This information is not intended to replace advice given to you by your health care provider. Make sure you discuss any questions you have with your health care provider.   Document Released: 01/09/2011 Document Revised: 12/28/2014 Document Reviewed: 05/30/2013 Elsevier Interactive Patient Education 2016 [place] Anesthesia, Adult, Care After Refer to this sheet in the next few weeks. These instructions provide you with information on caring for yourself after your procedure. Your health care provider may also give you more specific instructions. Your treatment has been planned according to current medical practices, but problems sometimes occur. Call your health care provider if you have any problems or questions after your procedure. WHAT TO EXPECT AFTER THE PROCEDURE After the procedure, it  is typical to experience:  Sleepiness.  Nausea and vomiting. HOME CARE INSTRUCTIONS  For the first 24 hours after general anesthesia:  Have a responsible person with you.  Do not drive a car. If you are alone, do not take public transportation.  Do not drink alcohol.  Do not take medicine that has not been prescribed by your health care provider.  Do not sign important papers or make important decisions.  You may  resume a normal diet and activities as directed by your health care provider.  Change bandages (dressings) as directed.  If you have questions or problems that seem related to general anesthesia, call the hospital and ask for the anesthetist or anesthesiologist on call. SEEK MEDICAL CARE IF:  You have nausea and vomiting that continue the day after anesthesia.  You develop a rash. SEEK IMMEDIATE MEDICAL CARE IF:   You have difficulty breathing.  You have chest pain.  You have any allergic problems.   This information is not intended to replace advice given to you by your health care provider. Make sure you discuss any questions you have with your health care provider.   Document Released: 03/15/2001 Document Revised: 12/28/2014 Document Reviewed: 04/06/2012 Elsevier Interactive Patient Education [employer].

## 2015-12-24 NOTE — Anesthesia Procedure Notes (Signed)
Procedure Name: LMA Insertion Date/Time: 12/24/2015 7:40 AM Performed by: Delaney Meigs Pre-anesthesia Checklist: Patient identified, Emergency Drugs available, Suction available, Patient being monitored and Timeout performed Patient Re-evaluated:Patient Re-evaluated prior to inductionOxygen Delivery Method: Circle system utilized and Simple face mask Preoxygenation: Pre-oxygenation with 100% oxygen Intubation Type: IV induction Ventilation: Mask ventilation without difficulty LMA: LMA inserted LMA Size: 4.5 Number of attempts: 1 Placement Confirmation: breath sounds checked- equal and bilateral and positive ETCO2 Tube secured with: Tape

## 2015-12-24 NOTE — Anesthesia Preprocedure Evaluation (Signed)
Anesthesia Evaluation  Patient identified by MRN, date of birth, ID band Patient awake    Reviewed: Allergy & Precautions, H&P , NPO status , Patient's Chart, lab work & pertinent test results  History of Anesthesia Complications Negative for: history of anesthetic complications  Airway Mallampati: III  TM Distance: >3 FB Neck ROM: full    Dental  (+) Poor Dentition, Chipped   Pulmonary neg pulmonary ROS, neg shortness of breath,    Pulmonary exam normal breath sounds clear to auscultation       Cardiovascular Exercise Tolerance: Good (-) angina(-) Past MI and (-) DOE negative cardio ROS Normal cardiovascular exam Rhythm:regular Rate:Normal     Neuro/Psych negative neurological ROS  negative psych ROS   GI/Hepatic Neg liver ROS, PUD, neg GERD  ,  Endo/Other  negative endocrine ROS  Renal/GU negative Renal ROS  negative genitourinary   Musculoskeletal   Abdominal   Peds  Hematology negative hematology ROS (+)   Anesthesia Other Findings Past Medical History:   History of stomach ulcers                                    Colitis                                                      Elevated PSA                                                Prostate cancer  Past Surgical History:   TOTAL HIP ARTHROPLASTY                          Right              COLONOSCOPY                                                   BACK SURGERY                                                  COLONOSCOPY WITH PROPOFOL                       N/A 09/10/2015      Comment:Procedure: COLONOSCOPY WITH PROPOFOL;  Surgeon:              Lollie Sails, MD;  Location: [facility]               ENDOSCOPY;  Service: Endoscopy;  Laterality:               N/A;     Reproductive/Obstetrics negative OB ROS                             Anesthesia Physical Anesthesia  Plan  ASA: III  Anesthesia Plan: General and General LMA    Post-op Pain Management:    Induction:   Airway Management Planned:   Additional Equipment:   Intra-op Plan:   Post-operative Plan:   Informed Consent: I have reviewed the patients History and Physical, chart, labs and discussed the procedure including the risks, benefits and alternatives for the proposed anesthesia with the patient or authorized representative who has indicated his/her understanding and acceptance.   Dental Advisory Given  Plan Discussed with: Anesthesiologist, CRNA and Surgeon  Anesthesia Plan Comments:         Anesthesia Quick Evaluation

## 2015-12-24 NOTE — Op Note (Signed)
Preoperative diagnosis: Adenocarcinoma of the prostate   Postoperative diagnosis: Same   Procedure: I-125 prostate seed implantation, cystoscopy  Surgeon: Hollice Espy M.D. , Lavena Stanford, M.D.   Anesthesia: General  Drains: none  Complications: none  Indications: 60 yo M with Gleason 3+3 prostate cancer, PSA 15 who has elected brachytherapy treatment.     Procedure: Patient was brought to operating suite and placement table in the supine position. At this time, a universal timeout protocol was performed, all team members were identified, Venodyne boots are placed, and he was administered IV Ancef in the preoperative period. He was placed in lithotomy position and prepped and draped in usual manner. Radiation oncology department placed a transrectal ultrasound probe anchoring stand/ grid and aligned with previous imaging from the volume study. Foley catheter was inserted without difficulty.  All needle passage was done with real-time transrectal ultrasound guidance in both the transverse and sagittal plains in order to achieve the desired preplanned position. A total of 15 needles were placed.  50 active seeds were implanted. The Foley catheter was removed and a rigid cystoscopy failed to show any seeds outside the prostate without evidence of trauma to the urethral, prostatic fossa, or bladder.  The bladder was drained.  A fluoroscopic image was then obtained showing excellent distrubution of the brachytherapy seeds.  Each seed was counted and counts were correct.    The patient was then repositioned in the supine position, reversed from anesthesia, and taken to the PACU in stable condition.

## 2015-12-24 NOTE — Anesthesia Postprocedure Evaluation (Signed)
Anesthesia Post Note  Patient: Michael Mccullough  Procedure(s) Performed: Procedure(s) (LRB): RADIOACTIVE SEED IMPLANT/BRACHYTHERAPY IMPLANT (N/A) CYSTOSCOPY (N/A)  Patient location during evaluation: PACU Anesthesia Type: General Level of consciousness: awake and alert Pain management: pain level controlled Vital Signs Assessment: post-procedure vital signs reviewed and stable Respiratory status: spontaneous breathing, nonlabored ventilation, respiratory function stable and patient connected to nasal cannula oxygen Cardiovascular status: blood pressure returned to baseline and stable Postop Assessment: no signs of nausea or vomiting Anesthetic complications: no    Last Vitals:  Filed Vitals:   12/24/15 0930 12/24/15 0945  BP: 109/65 107/64  Pulse: 86 84  Temp:    Resp: 16 16    Last Pain:  Filed Vitals:   12/24/15 0956  PainSc: Asleep                 Precious Haws Arturo Sofranko

## 2015-12-31 MED ORDER — LIDOCAINE HCL (PF) 1 % IJ SOLN
INTRAMUSCULAR | Status: AC
  Filled 2015-12-31: qty 2

## 2015-12-31 MED ORDER — POVIDONE-IODINE 5 % OP SOLN
OPHTHALMIC | Status: AC
  Filled 2015-12-31: qty 30

## 2015-12-31 MED ORDER — ARMC OPHTHALMIC DILATING GEL
OPHTHALMIC | Status: AC
  Filled 2015-12-31: qty 0.25

## 2015-12-31 MED ORDER — TETRACAINE HCL 0.5 % OP SOLN
OPHTHALMIC | Status: AC
  Filled 2015-12-31: qty 2

## 2015-12-31 MED ORDER — MOXIFLOXACIN HCL 0.5 % OP SOLN
OPHTHALMIC | Status: AC
  Filled 2015-12-31: qty 3

## 2016-01-02 NOTE — Progress Notes (Signed)
Radiation Oncology Follow up Note  Name: Michael Mccullough   Date:   11/12/2015 MRN:  [id number] DOB: August 22, 1956    This 60 y.o. male presents to the OR today for I-125 interstitial implant.  REFERRING PROVIDER: No ref. provider found Patient is a 60 year old male presented with an elevated PSA in the 15 range status post transrectal ultrasound-guided biopsy positive for Gleason 6 (3+3) adenocarcinoma the prostate. Patient opted for I-125 interstitial implant seen today for both history and physical and volume study. He is doing well does have erectile dysfunction. No voiding symptoms. He was brought to the operating room today after volume study was performed and BrachyVision treatment planning was performed to determine seed implantation verification and positioning HPI: As above.  COMPLICATIONS OF TREATMENT: none  FOLLOW UP COMPLIANCE: keeps appointments      PLAN: patient was taken to the operating room and general anesthesia was administered. Legs were immobilized in stirrups and patient was positioned in the exact same proportions as original volume study. Patient was prepped and Foley catheter was placed. Ultrasound guidance identified the prostate and recreated the original set up as per treatment planning volume study. 15 needles were placed under ultrasound guidance with PVCs delivered to the prostate volume. 38 seeds were implanted and verified on post procedure x-ray count After completion of procedure cystoscopy was performed by urology and no evidence of seeds in the bladder were noted. Patient tolerated the procedure extremely well. Initial plain film as doublecheck identified 50 seeds in the prostate. Patient has followup appointment in one month for CT scan for quality assurance will be performed.     Armstead Peaks., MD

## 2016-01-21 ENCOUNTER — Ambulatory Visit
Admission: RE | Admit: 2016-01-21 | Discharge: 2016-01-21 | Disposition: A | Discharge status: Home / Self Care | Payer: BLUE CROSS/BLUE SHIELD | Source: Ambulatory Visit | Attending: Radiation Oncology | Admitting: Radiation Oncology

## 2016-01-21 ENCOUNTER — Encounter: Payer: Self-pay | Admitting: Radiation Oncology

## 2016-01-21 VITALS — BP 128/75 | HR 67 | Temp 97.7°F | Wt 179.8 lb

## 2016-01-21 DIAGNOSIS — C61 Malignant neoplasm of prostate: Secondary | ICD-10-CM | POA: Diagnosis not present

## 2016-01-21 NOTE — Progress Notes (Signed)
Radiation Oncology Follow up Note  Name: Michael Mccullough   Date:   01/21/2016 MRN:  [id number] DOB: 1956-07-26    This 60 y.o. male presents to the clinic today for follow-up for prostate cancer 1 month out from I-125 interstitial implant.  REFERRING PROVIDER: Sherrin Daisy, MD  HPI: Patient is a 60 year old male now one month out having completed I-125 interstitial implant for Gleason 6 adenocarcinoma the prostate presenting the PSA in the 15 range. He is seen today in routine follow-up and is doing well. Specifically denies diarrhea dysuria. Has nocturia 2-3 some weak stream and slight urgency and frequency..  COMPLICATIONS OF TREATMENT: none  FOLLOW UP COMPLIANCE: keeps appointments   PHYSICAL EXAM:  BP 128/75 mmHg  Pulse 67  Temp(Src) 97.7 F (36.5 C)  Wt 179 lb 12.6 oz (81.55 kg) On rectal exam rectal sphincter tone is good. Prostate is smooth contracted without evidence of nodularity or mass. Sulcus is preserved bilaterally. No discrete nodularity is identified. No other rectal abnormalities are noted. Well-developed well-nourished patient in NAD. HEENT reveals PERLA, EOMI, discs not visualized.  Oral cavity is clear. No oral mucosal lesions are identified. Neck is clear without evidence of cervical or supraclavicular adenopathy. Lungs are clear to A&P. Cardiac examination is essentially unremarkable with regular rate and rhythm without murmur rub or thrill. Abdomen is benign with no organomegaly or masses noted. Motor sensory and DTR levels are equal and symmetric in the upper and lower extremities. Cranial nerves II through XII are grossly intact. Proprioception is intact. No peripheral adenopathy or edema is identified. No motor or sensory levels are noted. Crude visual fields are within normal range.  RADIOLOGY RESULTS: CT scan for quality assurance on C placement is reviewed shows excellent placement resources  PLAN: Present time patient is doing well I've explained to him he  is still radioactive for another month and should follow radiation safety precautions as outlined. Otherwise I am please with the source placement on his quality assurance CT scan. I have asked to see him back in 3 months for follow-up and will perform first PSA at that time. Patient knows to call sooner with any concerns.  I would like to take this opportunity for allowing me to participate in the care of your patient.Armstead Peaks., MD

## 2016-01-22 DIAGNOSIS — C61 Malignant neoplasm of prostate: Secondary | ICD-10-CM | POA: Diagnosis not present

## 2016-06-05 ENCOUNTER — Ambulatory Visit
Admission: RE | Admit: 2016-06-05 | Discharge: 2016-06-05 | Disposition: A | Discharge status: Home / Self Care | Payer: BLUE CROSS/BLUE SHIELD | Source: Ambulatory Visit | Attending: Radiation Oncology | Admitting: Radiation Oncology

## 2016-06-05 ENCOUNTER — Inpatient Hospital Stay: Payer: BLUE CROSS/BLUE SHIELD | Attending: Radiation Oncology

## 2016-06-05 ENCOUNTER — Other Ambulatory Visit: Payer: Self-pay | Admitting: *Deleted

## 2016-06-05 ENCOUNTER — Encounter: Payer: Self-pay | Admitting: Radiation Oncology

## 2016-06-05 VITALS — BP 111/72 | HR 76 | Temp 97.8°F | Resp 18 | Wt 156.2 lb

## 2016-06-05 DIAGNOSIS — C61 Malignant neoplasm of prostate: Secondary | ICD-10-CM | POA: Insufficient documentation

## 2016-06-05 DIAGNOSIS — Z923 Personal history of irradiation: Secondary | ICD-10-CM | POA: Diagnosis not present

## 2016-06-05 LAB — PSA: PSA: 2.44 ng/mL (ref 0.00–4.00)

## 2016-06-05 NOTE — Progress Notes (Signed)
Radiation Oncology Follow up Note  Name: Michael Mccullough   Date:   06/05/2016 MRN:  [id number] DOB: 02/11/1956    This 60 y.o. male presents to the clinic today for 5 month follow-up for prostate cancer status post I-125 interstitial implant.  REFERRING PROVIDER: Sherrin Daisy, MD  HPI: Patient is a 60 year old male now seen out approximately 5 months having completed I-125 interstitial implant for a Gleason 6 (3+3) adenocarcinoma the prostate presenting the PSA in the 15 range. He is seen today in routine follow-up and is doing well. He specifically denies diarrhea dysuria or any other GI/GU complaints..  COMPLICATIONS OF TREATMENT: none  FOLLOW UP COMPLIANCE: keeps appointments   PHYSICAL EXAM:  BP 111/72 mmHg  Pulse 76  Temp(Src) 97.8 F (36.6 C)  Resp 18  Wt 156 lb 3.1 oz (70.85 kg) On rectal exam rectal sphincter tone is good. Prostate is smooth contracted without evidence of nodularity or mass. Sulcus is preserved bilaterally. No discrete nodularity is identified. No other rectal abnormalities are noted. Well-developed well-nourished patient in NAD. HEENT reveals PERLA, EOMI, discs not visualized.  Oral cavity is clear. No oral mucosal lesions are identified. Neck is clear without evidence of cervical or supraclavicular adenopathy. Lungs are clear to A&P. Cardiac examination is essentially unremarkable with regular rate and rhythm without murmur rub or thrill. Abdomen is benign with no organomegaly or masses noted. Motor sensory and DTR levels are equal and symmetric in the upper and lower extremities. Cranial nerves II through XII are grossly intact. Proprioception is intact. No peripheral adenopathy or edema is identified. No motor or sensory levels are noted. Crude visual fields are within normal range.  RADIOLOGY RESULTS: No current films for review  PLAN: Present time patient is doing well with very little side effect profile. I've run a PSA level on him today and report that  separately. Otherwise I'm please was overall progress. I've asked to see him back in 6 months and will have a PSA performed again at that time. Patient knows to call sooner with any concerns.  I would like to take this opportunity to thank you for allowing me to participate in the care of your patient.Armstead Peaks., MD

## 2016-10-15 ENCOUNTER — Encounter: Payer: Self-pay | Admitting: *Deleted

## 2016-12-09 ENCOUNTER — Other Ambulatory Visit: Payer: BLUE CROSS/BLUE SHIELD

## 2016-12-09 ENCOUNTER — Ambulatory Visit: Payer: BLUE CROSS/BLUE SHIELD | Admitting: Radiation Oncology

## 2016-12-18 ENCOUNTER — Other Ambulatory Visit: Payer: Self-pay | Admitting: *Deleted

## 2016-12-22 ENCOUNTER — Ambulatory Visit: Payer: BLUE CROSS/BLUE SHIELD | Attending: Radiation Oncology | Admitting: Radiation Oncology

## 2016-12-22 ENCOUNTER — Inpatient Hospital Stay: Payer: BLUE CROSS/BLUE SHIELD | Attending: Radiation Oncology

## 2018-01-19 ENCOUNTER — Ambulatory Visit: Payer: Self-pay | Admitting: Radiation Oncology

## 2020-07-16 ENCOUNTER — Encounter: Admit: 2020-07-16 | Discharge: 2020-07-17 | Payer: PRIVATE HEALTH INSURANCE

## 2020-07-16 DIAGNOSIS — N400 Enlarged prostate without lower urinary tract symptoms: Principal | ICD-10-CM

## 2020-07-16 DIAGNOSIS — M1990 Unspecified osteoarthritis, unspecified site: Principal | ICD-10-CM

## 2020-07-16 DIAGNOSIS — C61 Malignant neoplasm of prostate: Principal | ICD-10-CM

## 2020-07-16 DIAGNOSIS — K219 Gastro-esophageal reflux disease without esophagitis: Principal | ICD-10-CM

## 2020-07-16 DIAGNOSIS — N5235 Erectile dysfunction following radiation therapy: Principal | ICD-10-CM

## 2020-07-16 DIAGNOSIS — K5190 Ulcerative colitis, unspecified, without complications: Principal | ICD-10-CM

## 2020-07-16 DIAGNOSIS — Z1159 Encounter for screening for other viral diseases: Principal | ICD-10-CM

## 2020-07-16 DIAGNOSIS — F5101 Primary insomnia: Principal | ICD-10-CM

## 2020-07-25 DIAGNOSIS — C61 Malignant neoplasm of prostate: Principal | ICD-10-CM

## 2020-08-01 ENCOUNTER — Encounter: Admit: 2020-08-01 | Discharge: 2020-08-14 | Payer: PRIVATE HEALTH INSURANCE

## 2020-08-01 ENCOUNTER — Ambulatory Visit: Admit: 2020-08-01 | Discharge: 2020-08-30 | Payer: PRIVATE HEALTH INSURANCE

## 2020-08-01 ENCOUNTER — Ambulatory Visit: Admit: 2020-08-01 | Discharge: 2020-08-14 | Payer: PRIVATE HEALTH INSURANCE

## 2020-08-05 DIAGNOSIS — C61 Malignant neoplasm of prostate: Principal | ICD-10-CM

## 2020-08-13 ENCOUNTER — Encounter
Admit: 2020-08-13 | Discharge: 2020-08-20 | Payer: PRIVATE HEALTH INSURANCE | Attending: Radiation Oncology | Primary: Radiation Oncology

## 2020-08-13 ENCOUNTER — Ambulatory Visit
Admit: 2020-08-13 | Discharge: 2020-08-13 | Payer: PRIVATE HEALTH INSURANCE | Attending: Hematology & Oncology | Primary: Hematology & Oncology

## 2020-08-13 DIAGNOSIS — C61 Malignant neoplasm of prostate: Principal | ICD-10-CM

## 2020-08-14 DIAGNOSIS — C7951 Secondary malignant neoplasm of bone: Principal | ICD-10-CM

## 2020-08-14 DIAGNOSIS — C61 Malignant neoplasm of prostate: Principal | ICD-10-CM

## 2020-08-19 MED ORDER — BICALUTAMIDE 50 MG TABLET
ORAL_TABLET | Freq: Every day | ORAL | 0 refills | 10 days | Status: CP
Start: 2020-08-19 — End: ?

## 2020-08-20 ENCOUNTER — Institutional Professional Consult (permissible substitution): Admit: 2020-08-20 | Discharge: 2020-08-20 | Payer: PRIVATE HEALTH INSURANCE

## 2020-08-20 DIAGNOSIS — C61 Malignant neoplasm of prostate: Principal | ICD-10-CM

## 2020-08-27 ENCOUNTER — Encounter: Admit: 2020-08-27 | Discharge: 2020-08-28 | Payer: PRIVATE HEALTH INSURANCE

## 2020-09-02 ENCOUNTER — Ambulatory Visit: Admit: 2020-09-02 | Discharge: 2020-09-03 | Payer: PRIVATE HEALTH INSURANCE

## 2020-09-02 DIAGNOSIS — N289 Disorder of kidney and ureter, unspecified: Principal | ICD-10-CM

## 2020-09-02 DIAGNOSIS — K5190 Ulcerative colitis, unspecified, without complications: Principal | ICD-10-CM

## 2020-09-02 DIAGNOSIS — F5101 Primary insomnia: Principal | ICD-10-CM

## 2020-09-02 DIAGNOSIS — K219 Gastro-esophageal reflux disease without esophagitis: Principal | ICD-10-CM

## 2020-09-02 DIAGNOSIS — C61 Malignant neoplasm of prostate: Principal | ICD-10-CM

## 2020-09-02 DIAGNOSIS — C7951 Secondary malignant neoplasm of bone: Principal | ICD-10-CM

## 2020-09-11 MED ORDER — PEG-ELECTROLYTE SOLUTION 420 GRAM ORAL SOLUTION
Freq: Once | ORAL | 0 refills | 1 days | Status: CP
Start: 2020-09-11 — End: 2020-09-11
  Filled 2020-09-12: qty 4000, 1d supply, fill #0

## 2020-09-12 MED FILL — PEG-ELECTROLYTE SOLUTION 420 GRAM ORAL SOLUTION: 1 days supply | Qty: 4000 | Fill #0 | Status: AC

## 2020-10-03 ENCOUNTER — Other Ambulatory Visit: Admit: 2020-10-03 | Discharge: 2020-10-04 | Payer: PRIVATE HEALTH INSURANCE

## 2020-10-03 ENCOUNTER — Encounter
Admit: 2020-10-03 | Discharge: 2020-10-03 | Payer: PRIVATE HEALTH INSURANCE | Attending: Hematology & Oncology | Primary: Hematology & Oncology

## 2020-10-03 DIAGNOSIS — C7951 Secondary malignant neoplasm of bone: Principal | ICD-10-CM

## 2020-10-03 DIAGNOSIS — C61 Malignant neoplasm of prostate: Principal | ICD-10-CM

## 2020-10-03 MED ORDER — PREDNISONE 5 MG TABLET
ORAL_TABLET | Freq: Every day | ORAL | 11 refills | 30 days | Status: CP
Start: 2020-10-03 — End: ?
  Filled 2020-10-10: qty 30, 30d supply, fill #0

## 2020-10-03 MED ORDER — ABIRATERONE 250 MG TABLET
ORAL_TABLET | Freq: Every day | ORAL | 11 refills | 30 days | Status: CP
Start: 2020-10-03 — End: ?
  Filled 2020-10-10: qty 60, 15d supply, fill #0

## 2020-10-04 DIAGNOSIS — C61 Malignant neoplasm of prostate: Principal | ICD-10-CM

## 2020-10-09 NOTE — Unmapped (Signed)
[facility] Specialty Medication Onboarding    Specialty Medication: Abiraterone 250mg  tablets  Prior Authorization: Approved   Financial Assistance: No - copay  <$25  Final Copay/Day Supply: $0 / 15 days    Insurance Restrictions: Yes - max 15 day supply for first 1-3 months of therapy     Notes to Pharmacist:     The triage team has completed the benefits investigation and has determined that the patient is able to fill this medication at [facility]. Please contact the patient to complete the onboarding or follow up with the prescribing physician as needed.

## 2020-10-10 MED FILL — PREDNISONE 5 MG TABLET: 30 days supply | Qty: 30 | Fill #0 | Status: AC

## 2020-10-10 MED FILL — ABIRATERONE 250 MG TABLET: 15 days supply | Qty: 60 | Fill #0 | Status: AC

## 2020-10-10 NOTE — Unmapped (Signed)
[facility]   Patient Onboarding/Medication Counseling    Leon Gonzalez is a 64 y.o. male with prostate cancer who I am counseling today on initiation of therapy.  I am speaking to the patient.    Was a Nurse, learning disability used for this call? No    Verified patient's date of birth / HIPAA.    Specialty medication(s) to be sent: Hematology/Oncology: abiraterone 250mg     Non-specialty medications/supplies to be sent: prednisone 5 mg    Medications not needed at this time: none     Zytiga (Abiraterone)    Medication & Administration     Dosage: Take 4 tablets (1,000 mg total) by mouth daily.     Administration:   ??? Take on an empty stomach (at least 1 hour before or 2 hours after food)  ??? Take with prednisone  ??? Take with calcium and vitamin D    Adherence/Missed dose instructions:  ??? If miss a dose, wait until next day to take normal dose    Goals of Therapy     ??? Prevent disease progression    Side Effects & Monitoring Parameters     ??? Common side effects  ??? Flushing/hot flashes  ??? Signs of high blood pressure  (bad headache, dizziness/passing out, or change in eyesight)  ??? Muscle pain  ??? Joint pain or swelling  ??? Heartburn  ??? Cough, stuffy nose, sore throat  ??? Upset stomach, nausea or vomiting  ??? Diarrhea or constipation  ??? Feeling weak or tired  ??? Trouble sleeping    ??? The following side effects should be reported to the provider:  ??? Allergic reaction  ??? Electrolyte problems (mood changes, confusion, muscle pain, abnormal heartbeat, seizures)  ??? Signs of weak adrenal gland (bad upset stomach or throwing up, very bad dizziness or passing out, muscle weakness, feeling very tired, mood changes, not hungry, or weight loss)  ??? Signs of UTI  ??? Signs of high blood sugar (confusion, sleepiness, increased thirst, hunger, and urination, fruity breath)  ??? Feeling weak or tired  ??? Bruising  ??? Bone pain  ??? Swelling, weight gain, or trouble breathing  ??? Liver problems (dark urine, not hungry, upset stomach, light colored stool, yellow skin or eyes    Monitoring Parameters  ??? LFT monitoring every 2 weeks for 3 months and monthly thereafter  ??? Serum potassium levels frequently during treatment  ??? Monitor blood pressure    Contraindications, Warnings, & Precautions     ??? Contraindicated in women who are or may become pregnant  ??? Adrenocortical insufficiency  ??? Hepatotoxicity  ??? Mineralocorticoid excess - monitor for hypertension, hypokalemia, and fluid retention. Take with a corticosteroid.  ??? Cardiovascular disease - control hypertension and correct hypokalemia prior to and during treatment.    Drug/Food Interactions     ??? Medication list reviewed in Epic. The patient was instructed to inform the care team before taking any new medications or supplements. No drug interactions identified.   ??? List any food/vaccine restrictions here.     Storage, Handling Precautions, & Disposal     ??? Store at room temperature  ??? This drug is considered hazardous and should be handled as little as possible.  If someone else helps with medication administration, they should wear gloves.      Current Medications (including OTC/herbals), Comorbidities and Allergies     Current Outpatient Medications   Medication Sig Dispense Refill   ??? abiraterone (ZYTIGA) 250 mg Tab tablet Take 4 tablets (  1,000 mg total) by mouth daily. 120 tablet 11   ??? acetaminophen (TYLENOL) 325 MG tablet Take 325 mg by mouth every eight (8) hours as needed.     ??? calcium carbonate (OS-CAL) 1,500 mg (600 mg elem calcium) tablet Take 1 tablet by mouth Two (2) times a day.     ??? cholecalciferol, vitamin D3-25 mcg, 1,000 unit,, (VITAMIN D3) 25 mcg (1,000 unit) capsule Take 25 mcg by mouth daily.     ??? naproxen sodium (ALEVE) 220 MG tablet Take 220 mg by mouth every six (6) hours as needed for pain.     ??? predniSONE (DELTASONE) 5 MG tablet Take 1 tablet (5 mg total) by mouth daily. 30 tablet 11   ??? sildenafiL (VIAGRA) 50 MG tablet Take 50 mg by mouth.        No current facility-administered medications for this visit.       Allergies   Allergen Reactions   ??? Aspirin Other (See Comments)     GI UPSET.  Stomach ulcer         Patient Active Problem List   Diagnosis   ??? Prostate cancer (CMS-HCC)   ??? Chronic hip pain   ??? 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 Primary insomnia   ??? Ulcerative colitis (CMS-HCC)   ??? DJD (degenerative joint disease)   ??? GERD (gastroesophageal reflux disease)   ??? 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 Renal insufficiency       Reviewed and up to date in Epic.    Appropriateness of Therapy     Is medication and dose appropriate based on diagnosis? Yes    Prescription has been clinically reviewed: Yes    Baseline Quality of Life Assessment      How many days over the past month did your prostate cancer  keep you from your normal activities? For example, brushing your teeth or getting up in the morning. 0    Financial Information     Medication Assistance provided: Prior Authorization    Anticipated copay of $0 / 15 days reviewed with patient. Verified delivery address.    Delivery Information     Scheduled delivery date: 10/11/20    Expected start date: 10/11/20    Medication will be delivered via Next Day Courier to the prescription address in [facility].  This shipment will not require a signature.      Explained the services we provide at [facility] and that each month we would call to set up refills.  Stressed importance of returning phone calls so that we could ensure they receive their medications in time each month.  Informed patient that we should be setting up refills 7-10 days prior to when they will run out of medication.  A pharmacist will reach out to perform a clinical assessment periodically.  Informed patient that a welcome packet and a drug information handout will be sent.      Patient verbalized understanding of the above information as well as how to contact the pharmacy at [phone] option [phone] with any questions/concerns.  The pharmacy is open Monday through Friday 8:30am-4:30pm.  A pharmacist is available 24/7 via pager to answer any clinical questions they may have.    Patient Specific Needs     - Does the patient have any physical, cognitive, or cultural barriers? No    - Patient prefers to have medications discussed with  Patient     - Is the  patient or caregiver able to read and understand education materials at a high school level or above? Yes    - Patient's primary language is  English     - Is the patient high risk? Yes, patient is taking oral chemotherapy. Appropriateness of therapy as been assessed    - Does the patient require a Care Management Plan? No     - Does the patient require physician intervention or other additional services (i.e. nutrition, smoking cessation, social work)? No      Rielynn Trulson [facility] Specialty Pharmacist

## 2020-10-18 NOTE — Unmapped (Signed)
[facility] Clinical Assessment & Refill Coordination Note    Leon Gonzalez, Leon Gonzalez: 08/28/1956  Phone: [phone] (home)     All above HIPAA information was verified with patient.     Was a Nurse, learning disability used for this call? No    Specialty Medication(s):   Hematology/Oncology: abiraterone 250mg      Current Outpatient Medications   Medication Sig Dispense Refill   ??? abiraterone (ZYTIGA) 250 mg Tab tablet Take 4 tablets (1,000 mg total) by mouth daily. 120 tablet 11   ??? acetaminophen (TYLENOL) 325 MG tablet Take 325 mg by mouth every eight (8) hours as needed.     ??? calcium carbonate (OS-CAL) 1,500 mg (600 mg elem calcium) tablet Take 1 tablet by mouth Two (2) times a day.     ??? cholecalciferol, vitamin D3-25 mcg, 1,000 unit,, (VITAMIN D3) 25 mcg (1,000 unit) capsule Take 25 mcg by mouth daily.     ??? naproxen sodium (ALEVE) 220 MG tablet Take 220 mg by mouth every six (6) hours as needed for pain.     ??? predniSONE (DELTASONE) 5 MG tablet Take 1 tablet (5 mg total) by mouth daily. 30 tablet 11   ??? sildenafiL (VIAGRA) 50 MG tablet Take 50 mg by mouth.        No current facility-administered medications for this visit.        Changes to medications: [name] reports no changes at this time.    Allergies   Allergen Reactions   ??? Aspirin Other (See Comments)     GI UPSET.  Stomach ulcer         Changes to allergies: No    SPECIALTY MEDICATION ADHERENCE     Abiraterone 250 mg: 11 days of medicine on hand     Medication Adherence    Patient reported X missed doses in the last month: 0  Specialty Medication: abi 250 mg - 4 tabs daily  Patient is on additional specialty medications: No  Informant: patient  Confirmed plan for next specialty medication refill: delivery by pharmacy          Specialty medication(s) dose(s) confirmed: Regimen is correct and unchanged.     Are there any concerns with adherence? No - [facility] started medication this week.    Adherence counseling provided? Not needed    CLINICAL MANAGEMENT AND INTERVENTION      Clinical Benefit Assessment:    Do you feel the medicine is effective or helping your condition? NA - just started meds    Clinical Benefit counseling provided? Not needed    Adverse Effects Assessment:    Are you experiencing any side effects? No    Are you experiencing difficulty administering your medicine? No    Quality of Life Assessment:    How many days over the past month did your prostate cancer  keep you from your normal activities? For example, brushing your teeth or getting up in the morning. 0    Have you discussed this with your provider? Not needed    Therapy Appropriateness:    Is therapy appropriate? Yes, therapy is appropriate and should be continued    DISEASE/MEDICATION-SPECIFIC INFORMATION      N/A    PATIENT SPECIFIC NEEDS     - Does the patient have any physical, cognitive, or cultural barriers? No    - Is the patient high risk? Yes, patient is taking oral chemotherapy. Appropriateness of therapy as been assessed    - Does  the patient require a Care Management Plan? No     - Does the patient require physician intervention or other additional services (i.e. nutrition, smoking cessation, social work)? No      SHIPPING     Specialty Medication(s) to be Shipped:   Hematology/Oncology: abiraterone 250mg  (15 day supply)    Other medication(s) to be shipped: No additional medications requested for fill at this time - has enough prednisone from last delivery ( 30 day supply)     Changes to insurance: No    Delivery Scheduled: Yes, Expected medication delivery date: 10/24/20.     Medication will be delivered via Next Day Courier to the confirmed prescription address in [facility].    The patient will receive a drug information handout for each medication shipped and additional FDA Medication Guides as required.  Verified that patient has previously received a Conservation officer, historic buildings.    All of the patient's questions and concerns have been addressed.    [facility] Specialty Pharmacist

## 2020-10-23 MED FILL — ABIRATERONE 250 MG TABLET: ORAL | 15 days supply | Qty: 60 | Fill #1

## 2020-10-23 MED FILL — ABIRATERONE 250 MG TABLET: 15 days supply | Qty: 60 | Fill #1 | Status: AC

## 2020-10-31 NOTE — Unmapped (Signed)
[facility] Refill Coordination Note    Specialty Medication(s) to be Shipped:   Hematology/Oncology: abiraterone 250mg  and Transplant: Prednisone 5mg     Other medication(s) to be shipped: No additional medications requested for fill at this time     Leon [facility], DOB: Sep 07, 1956  Phone: [phone] (home)       All above HIPAA information was verified with patient.     Was a Nurse, learning disability used for this call? No    Completed refill call assessment today to schedule patient's medication shipment from the [facility] [phone]).       Specialty medication(s) and dose(s) confirmed: Regimen is correct and unchanged.   Changes to medications: [name] reports no changes at this time.  Changes to insurance: No  Questions for the pharmacist: No    Confirmed patient received Welcome Packet with first shipment. The patient will receive a drug information handout for each medication shipped and additional FDA Medication Guides as required.       DISEASE/MEDICATION-SPECIFIC INFORMATION        N/A    SPECIALTY MEDICATION ADHERENCE     Medication Adherence    Patient reported X missed doses in the last month: 0  Specialty Medication: Abiraterone 250mg   Patient is on additional specialty medications: Yes  Additional Specialty Medications: Prednisone 5mg   Patient Reported Additional Medication X Missed Doses in the Last Month: 0  Patient is on more than two specialty medications: No  Informant: patient                Abiraterone 250 mg: 14 days of medicine on hand   Prednisone 5 mg: 14 days of medicine on hand         SHIPPING     Shipping address confirmed in Epic.     Delivery Scheduled: Yes, Expected medication delivery date: 11/12/20.     Medication will be delivered via Same Day Courier to the prescription address in [facility] [place].    [name]   [facility] Specialty Technician

## 2020-11-05 ENCOUNTER — Encounter
Admit: 2020-11-05 | Discharge: 2020-11-05 | Payer: PRIVATE HEALTH INSURANCE | Attending: Anesthesiology | Primary: Anesthesiology

## 2020-11-05 ENCOUNTER — Ambulatory Visit: Admit: 2020-11-05 | Discharge: 2020-11-05 | Payer: PRIVATE HEALTH INSURANCE

## 2020-11-05 MED ADMIN — propofoL (DIPRIVAN) injection: INTRAVENOUS | @ 15:00:00 | Stop: 2020-11-05

## 2020-11-05 MED ADMIN — lidocaine (XYLOCAINE) 20 mg/mL (2 %) injection: INTRAVENOUS | @ 15:00:00 | Stop: 2020-11-05

## 2020-11-05 MED ADMIN — sodium chloride (NS) 0.9 % infusion: INTRAVENOUS | @ 15:00:00 | Stop: 2020-11-05

## 2020-11-05 MED ADMIN — sodium chloride (NS) 0.9 % infusion: 10 mL/h | INTRAVENOUS | @ 15:00:00 | Stop: 2020-11-05

## 2020-11-05 MED ADMIN — propofoL (DIPRIVAN) injection: INTRAVENOUS | @ 16:00:00 | Stop: 2020-11-05

## 2020-11-07 ENCOUNTER — Encounter: Admit: 2020-11-07 | Discharge: 2020-11-07 | Payer: PRIVATE HEALTH INSURANCE

## 2020-11-07 ENCOUNTER — Ambulatory Visit
Admit: 2020-11-07 | Discharge: 2020-11-07 | Payer: PRIVATE HEALTH INSURANCE | Attending: Hematology & Oncology | Primary: Hematology & Oncology

## 2020-11-07 DIAGNOSIS — Z79890 Hormone replacement therapy (postmenopausal): Principal | ICD-10-CM

## 2020-11-07 DIAGNOSIS — Z79899 Other long term (current) drug therapy: Principal | ICD-10-CM

## 2020-11-07 DIAGNOSIS — Z923 Personal history of irradiation: Principal | ICD-10-CM

## 2020-11-07 DIAGNOSIS — Z7952 Long term (current) use of systemic steroids: Principal | ICD-10-CM

## 2020-11-07 DIAGNOSIS — N529 Male erectile dysfunction, unspecified: Principal | ICD-10-CM

## 2020-11-07 DIAGNOSIS — K5190 Ulcerative colitis, unspecified, without complications: Principal | ICD-10-CM

## 2020-11-07 DIAGNOSIS — Z801 Family history of malignant neoplasm of trachea, bronchus and lung: Principal | ICD-10-CM

## 2020-11-07 DIAGNOSIS — C61 Malignant neoplasm of prostate: Principal | ICD-10-CM

## 2020-11-07 DIAGNOSIS — M25559 Pain in unspecified hip: Principal | ICD-10-CM

## 2020-11-07 DIAGNOSIS — Z8042 Family history of malignant neoplasm of prostate: Principal | ICD-10-CM

## 2020-11-07 DIAGNOSIS — K219 Gastro-esophageal reflux disease without esophagitis: Principal | ICD-10-CM

## 2020-11-07 DIAGNOSIS — G8929 Other chronic pain: Principal | ICD-10-CM

## 2020-11-07 DIAGNOSIS — C7951 Secondary malignant neoplasm of bone: Principal | ICD-10-CM

## 2020-11-07 DIAGNOSIS — F5101 Primary insomnia: Principal | ICD-10-CM

## 2020-11-07 LAB — HEPATIC FUNCTION PANEL
ALBUMIN: 3.8 g/dL (ref 3.4–5.0)
ALKALINE PHOSPHATASE: 70 U/L (ref 46–116)
ALT (SGPT): 22 U/L (ref 10–49)
AST (SGOT): 22 U/L (ref <=34)
BILIRUBIN DIRECT: 0.1 mg/dL (ref 0.00–0.30)
BILIRUBIN TOTAL: 0.4 mg/dL (ref 0.3–1.2)
PROTEIN TOTAL: 6.6 g/dL (ref 5.7–8.2)

## 2020-11-07 LAB — POTASSIUM: POTASSIUM: 3.8 mmol/L (ref 3.5–5.1)

## 2020-11-07 LAB — PSA: PROSTATE SPECIFIC ANTIGEN: 0.06 ng/mL (ref 0.00–4.00)

## 2020-11-07 NOTE — Unmapped (Signed)
[facility] Visit Note    Patient Name: Leon Gonzalez  Patient Age: 64 y.o.  Encounter Date: 11/07/2020  Attending Provider:  Avelyn Touch E. Philomena Course, MD  Referring physician: Maurie Boettcher, MD    Assessment  Patient Active Problem List   Diagnosis   ??? Prostate cancer (CMS-HCC)   ??? Chronic hip pain   ??? 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 Primary insomnia   ??? Ulcerative colitis (CMS-HCC)   ??? DJD (degenerative joint disease)   ??? GERD (gastroesophageal reflux disease)   ??? 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 Renal insufficiency     1. Metastatic hormone sensitive prostate cancer, low volume disease.    A 64 y.o. man with h/o Gleason 3+3=6 (8/12 cores positive) prostate cancer, s/p brachytherapy, now relapsing with bone mets at 3 sites. This would be classified as low volume disease. Rad onc declined to give radiation to sites of mets.    Started on ADT with Eligard on 08/20/2020. In 09/2020, abiraterone/prednisone added.    Doing well, PSA down to 0.06. Germline testing to be done.    Plan  1. Continue with ADT. Eligard 45 mg last given on 08/20/2020, next due on 4/31/2022 or later.  2. Abiraterone 1000 mg po daily and prednisone 5 mg po daily, prescribed.  -- Check LFT's q2 weeks X 3 months, then q monthly X 3 months.  3. Somatic tumor sequencing not feasible, due to not being able to locate the tumor.  4. - Ordered Misc DNA Sendout for custom [facility] prostate cancer panel, testing the following genes: BRCA1, BRCA2, ATM, BARD1, BRIP1, FANCA, CHEK2, HOXB13, MLH1, MSH2, MSH6, PMS2, EPCAM, PALB2, RAD51C, RAD51D, TP53   - Ordered E-consult to Adult Genetics--appreciate assistance with interpretation of results  - Patient educated on purpose, risks, benefits, and alternatives for genetic testing as well as e-consult. Specifically discussed price of genetic testing and e-consult; noted that we often leave genetic testing to Genetics, and if patient does not desire e-consult for streamlined genetic testing we will place an ambulatory referral.     5. Return in 4 weeks or so. Lab visit in 2 weeks.    I personally spent 40 minutes face-to-face and non-face-to-face in the care of this patient, which includes all pre, intra, and post visit time on the date of service.          Reason for Visit  Follow up of prostate cancer    History of Present Illness:  Oncology History Overview Note   ??? In 08/2015, PSA 15.2. DRE cT1c  ??? In 09/2015, prostate biopsy 3+3=6, involving 8/12 biopsies.  ??? In 12/2015, brachytherapy with I-125 seed implant  ??? In 08/2017, PSA 3.45  ??? In 08/2018, PSA 5.92  ??? In 06/2020, PSA 17.6. Bone scan uptake in L 10, 11 rib, R iliac bone, corresponding to lytic lesions on CT.  ??? On 08/20/2020, started on ADT with Eligard 45 mg and a course of bicalutamide  ??? 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  Pt has been doing well. Pt notes that he got a 15 day supply of abiraterone twice and is finishing the second 15 day supply. Pt noted no problems with taking abiraterone.  No complaints/issues noted.    Allergies:  Allergies   Allergen Reactions   ??? Aspirin Other (See Comments)     GI UPSET.  Stomach ulcer         Current Medications:    Current Outpatient Medications:   ???  abiraterone (ZYTIGA) 250 mg Tab tablet, Take 4 tablets (1,000 mg total) by mouth daily., Disp: 120 tablet, Rfl: 11  ???  acetaminophen (TYLENOL) 325 MG tablet, Take 325 mg by mouth every eight (8) hours as needed., Disp: , Rfl:   ???  calcium carbonate (OS-CAL) 1,500 mg (600 mg elem calcium) tablet, Take 1 tablet by mouth Two (2) times a day., Disp: , Rfl:   ???  cholecalciferol, vitamin D3-25 mcg, 1,000 unit,, (VITAMIN D3) 25 mcg (1,000 unit) capsule, Take 25 mcg by mouth daily., Disp: , Rfl:   ???  melatonin 3 mg Tab, Take 3 mg by mouth every evening., Disp: , Rfl:   ???  naproxen sodium (ALEVE) 220 MG tablet, Take 220 mg by mouth every six (6) hours as needed for pain., Disp: , Rfl:   ???  predniSONE (DELTASONE) 5 MG tablet, Take 1 tablet (5 mg total) by mouth daily., Disp: 30 tablet, Rfl: 11  ???  sildenafiL (VIAGRA) 50 MG tablet, Take 50 mg by mouth. , Disp: , Rfl:     Past Medical History and Social History  Past Medical History:   Diagnosis Date   ??? Bone cancer (CMS-HCC)    ??? Peptic ulcer disease 1976   ??? Prostate cancer (CMS-HCC)    ??? Ulcerative colitis (CMS-HCC)       Past Surgical History:   Procedure Laterality Date   ??? BACK SURGERY  2005   ??? PR COLONOSCOPY FLX DX W/COLLJ SPEC WHEN PFRMD N/A 11/05/2020    Procedure: COLONOSCOPY, FLEXIBLE, PROXIMAL TO SPLENIC FLEXURE; DIAGNOSTIC, W/WO COLLECTION SPECIMEN BY BRUSH OR WASH;  Surgeon: [name], MD;  Location: [facility];  Service: Gastroenterology   ??? TOTAL HIP ARTHROPLASTY  2012        Social History     Occupational History   ??? Not on file   Tobacco Use   ??? Smoking status: Never Smoker   ??? Smokeless tobacco: Never Used   Vaping Use   ??? Vaping Use: Never used   Substance and Sexual Activity   ??? Alcohol use: Yes     Alcohol/week: 1.0 standard drink     Types: 1 Shots of liquor per week   ??? Drug use: Never   ??? Sexual activity: Not on file       Family History    Prostate Cancer Family History Assessment:  ??? History of cancer in children (yes/no; if yes, what type AND age of diagnosis): No  ??? History of cancer in siblings (yes/no; if yes, provide relation, type of cancer, AND age of diagnosis): Yes, brother with prostate cancer, age 21's  ??? History of cancer in parents (yes/no; if yes, please specify parent, type of cancer, AND age of diagnosis): Yes, father with prostate cancer, age 75's. mother with lung cancer, age 75  ??? History of cancer in aunts/uncles/grandparents (yes/no; if yes, provide relation, type of cancer, AND age of diagnosis): No      Review of Systems:  A comprehensive review of 10 systems was negative except for pertinent positives noted in HPI.    Physical Exam:    VITAL SIGNS:  BP 131/74  - Pulse 83  - Temp 36.9 ??C (98.4 ??F) (Oral)  - Resp 18  - Ht 170.2 cm (5' 7)  - SpO2 99%  - BMI 28.19 kg/m??   ECOG Performance Status: 1  GENERAL: Well-developed, well-nourished patient in no acute distress.  HEAD: Normocephalic and atraumatic.  EYES: Conjunctivae are normal. No scleral icterus.  MOUTH/THROAT: Oropharynx is clear and moist.  No mucosal lesions.  NECK: Supple, no thyromegaly.  LYMPHATICS: No palpable cervical, supraclavicular, or axillary adenopathy.  CARDIOVASCULAR: Normal rate, regular rhythm and normal heart sounds.  Exam reveals no gallop and no friction rub.  No murmur heard.  PULMONARY/CHEST: Effort normal and breath sounds normal. No respiratory distress.  ABDOMINAL:  Soft. There is no distension. There is no tenderness. There is no rebound and no guarding.  MUSCULOSKELETAL: No clubbing, cyanosis, or lower extremity edema.  PSYCHIATRIC: Alert and oriented.  Normal mood and affect.  NEUROLOGIC: No focal motor deficit. Normal gait.  SKIN: Skin is warm, dry, and intact.      Results/Orders:    Lab on 11/07/2020   Component Date Value Ref Range Status   ??? PSA 11/07/2020 0.06  0.00 - 4.00 ng/mL Final   ??? Potassium 11/07/2020 3.8  3.5 - 5.1 mmol/L Final   ??? Albumin 11/07/2020 3.8  3.4 - 5.0 g/dL Final   ??? Total Protein 11/07/2020 6.6  5.7 - 8.2 g/dL Final   ??? Total Bilirubin 11/07/2020 0.4  0.3 - 1.2 mg/dL Final   ??? Bilirubin, Direct 11/07/2020 0.10  0.00 - 0.30 mg/dL Final   ??? AST 29/56/2130 22  <=34 U/L Final   ??? ALT 11/07/2020 22  10 - 49 U/L Final   ??? Alkaline Phosphatase 11/07/2020 70  46 - 116 U/L Final       Lab Results   Component Value Date    PSA 0.06 11/07/2020    PSA 0.59 10/03/2020    PSA 14.76 (H) 08/13/2020    PSA 17.62 (H) 07/16/2020             Orders placed or performed in visit on 11/07/20   ??? Ambulatory e-Consult to Cancer And Adult Genetics   ??? Clinic Appointment Request Physician, Lab   ??? LAB Appointment Request   ??? Clinic Appointment Request Physician, Lab   ??? Miscellaneous DNA Sendout           Molecular Pathology  Tumor mutation profiling  Unable to locate specimen    Germline testing  N/A    Pathology  Gleason 3+3=6, 8/12 cores in 2016    Imaging results:  CT AP on 08/01/2020  -Fiducial markers within the prostate gland. Known malignancy is not well demonstrated on current exam.  ??  -Few subcentimeter right external iliac nodes are identified. Attention is recommended on subsequent examinations  ??  - Hypoattenuating lesions of the pancreatic neck, indeterminate and may possibly reflect interdigitating fat versus IPMNs, too small to characterize. Nonemergent MRI/MRCP can better assess. This can also assess subcentimeter hepatic lesions described above.  ??  -Osseous findings are better evaluated on concurrent bone scan    Bone scan 08/01/2020  Abnormal focal increase in radiotracer uptake in the left 10th and 11th ribs and a very subtle increase in radiotracer uptake in the medial aspect of the right iliac bone, all corresponding to lytic  changes on recent CT abdomen pelvis. These are worrisome for metastatic osseous lesions.

## 2020-11-08 NOTE — Unmapped (Addendum)
Lab Results   Component Value Date    PSA 0.06 11/07/2020    PSA 0.59 10/03/2020    PSA 14.76 (H) 08/13/2020    PSA 17.62 (H) 07/16/2020   Lab to be drawn every 2 weeks for next 6 weeks.  In 4 weeks, clinic visit with me (with lab visit)    Please call [phone] to reach my nurse navigator Mauricia Area for any issues.    For emergencies on Nights, Weekends and Holidays  Call [phone] and ask for the hematology/oncology on call.    Griffin Basil, MD, PhD  Associate Professor of Medicine  Division of Hematology-Oncology    [facility]  Genitourinary Oncology Clinic  Nurse Navigator: Mauricia Area  Fax: [phone]    What is genetic testing, and how it is used?  - Genes are instructions that help your body grow and develop. Some genes specifically help prevent cancer. We are ordering genetic testing of 17 genes, including BRCA1 and BRCA2.   - Genetic testing checks to see if these ???cancer prevention??? genes are working correctly.  Some people have a misspelling in their genes that puts them at an increased risk for prostate cancer and potentially other cancer  - Your blood sample will be collected and shipped to a lab called [facility]. Results of this test take 3 weeks to come back. You may see this result in your [facility] before your next appointment with your provider. If you have questions about this result, please contact your provider.  - This testing will be used to help direct your medical care, both in management of your current cancer and possibly to guide your future cancer screening. The test result can also provide information about the cancer risk for your family members.    How much does the test cost out of pocket?  - Most people pay less than $100 for genetic testing. Your medical insurance will most likely cover all or a large portion of the cost.   - Once [facility] receives your sample, they will contact you with an estimated out-of-pocket cost via text to a mobile number or email. If the cost of testing is too high through insurance, the laboratory has a self-pay price of $250. In this case, you would have to contact [facility] and choose the self-pay option. [facility] also has a financial assistance program that can help with the cost.   - If you are concerned about the cost of your genetic testing, please contact [facility] at [phone] or email [email or web address]     What are the possible results?  - Positive - If one of your cancer prevention genes doesn???t work, we know certain treatment options may work better. In addition, your relatives could have testing to see if they???re at a higher risk of developing cancer in their lifetime.  - Negative -  If you don???t have a gene change, then other treatment options may be recommended.   - Variant of Uncertain Significance (VUS) - Sometimes we get an unclear result, but these are typically treated as negative results. The lab will follow this result over time for additional information about the variant in the scientific literature.     Any possible risks?  - Risks - this test may cost money out of pocket--maximum $250, although most patients pay nothing since the test is often covered by insurance. Some patients are also concerned with privacy of results. There are laws that protect the privacy of your genetic test  results.   - Benefits - this test may help your doctor choose the best treatment for your cancer. It may also provide additional information on cancer risks for you and your family.     What is an electronic consult?  - To help [place] interpret any of your genetic test results, we would like to ask our colleagues in [place] to help [place]. Their team will look at your medical record and ensure we are ordering the best test for you. This is called an ???electronic consult???. This will not require a separate appointment for most patients.   - Most patients will not have an out-of-pocket expense for this. If your insurance does not cover this service, the maximum bill you could receive would be around ~$100. Most patients will pay far less than that.

## 2020-11-08 NOTE — Unmapped (Signed)
Clinical Pharmacist Practitioner: [facility]    Assessment/ Plan:  Leon Gonzalez is a 64 y.o. man with h/o Gleason 3+3=6 (8/12 cores positive) prostate cancer, s/p brachytherapy, now relapsing with bone mets at 3 sites with plans to initiate abiratrone/prednisone in addition to ADT therapy.    1. Metastatic hormone sensitive prostate cancer, low volume disease- LFTs and Tbili WNL, K 3.8; clinic BP today 131/74. Reports hot flashes primarily at night, causing difficulty to fall asleep.  - Continue abiraterone 1000 mg daily and prednisone 5 mg daily  - S/p Eligard 45 mg q69mo on 08/21/20, next dose due on or after 02/16/21  - Check LFTs q2wks for the first 3 months after initiation of therapy    2. Insomnia- reports continuous hot flashes that results in difficulty sleeping. Patient interested to start medication to help with sleep.  - Start OTC melatonin 3 mg PO nightly  - Consideration of gabapentin if worsening symptoms of both insomnia and hot flashes    3. Bone pain secondary to bone metastasis- continues to report pain in arms and legs. Takes Tylenol and Aleve as needed with relief.   - Continue tylenol up to 4000 mg PRN alternating with naprexon PRN for pain  - Reassess pain at f/u visit with CPP    4. Bone Health   - Continue calcium 600 mg BID and Vitamin D 1000 units QD    F/u: Dr. Philomena Course and CPP 12/05/20  ______________________________________________________________________    Reason for visit:  Oral hormonal therapy monitoring and side effect management    Current Drug and Dose: Abiraterone 1000 mg daily and Prednisone 5 mg daily    Date of Initiation: 10/15/20    Oral Agent Toxicities:  Grade 1 Insomnia    Interval History:  Leon Gonzalez reports feeling ok today. He reports having hot flashes that have started ~1-2 weeks ago, primarily at night lasting all night long. Describes feeling hot so he would remove his blanket but it is an alternating cycle of getting cold and hot from removing/adding blanket. Also has intermittent diaphoresis on his forehead during the daytime. He states that his hot flashes causes him to have difficulty in staying asleep and is interested in starting medication to help with sleep. Otherwise, denies any new additional symptoms.    Adherence: Takes 4 tablets of abiraterone on an empty stomach and 1 tablet of prednisone, no missed doses    Drug Interactions: none    Medications:  Current Outpatient Medications   Medication Sig Dispense Refill   ??? abiraterone (ZYTIGA) 250 mg Tab tablet Take 4 tablets (1,000 mg total) by mouth daily. 120 tablet 11   ??? acetaminophen (TYLENOL) 325 MG tablet Take 325 mg by mouth every eight (8) hours as needed.     ??? calcium carbonate (OS-CAL) 1,500 mg (600 mg elem calcium) tablet Take 1 tablet by mouth Two (2) times a day.     ??? cholecalciferol, vitamin D3-25 mcg, 1,000 unit,, (VITAMIN D3) 25 mcg (1,000 unit) capsule Take 25 mcg by mouth daily.     ??? melatonin 3 mg Tab Take 3 mg by mouth every evening.     ??? naproxen sodium (ALEVE) 220 MG tablet Take 220 mg by mouth every six (6) hours as needed for pain.     ??? predniSONE (DELTASONE) 5 MG tablet Take 1 tablet (5 mg total) by mouth daily. 30 tablet 11   ??? sildenafiL (VIAGRA) 50 MG tablet Take 50 mg by mouth.  No current facility-administered medications for this visit.       Oncology History Overview Note   ??? In 08/2015, PSA 15.2. DRE cT1c  ??? In 09/2015, prostate biopsy 3+3=6, involving 8/12 biopsies.  ??? In 12/2015, brachytherapy with I-125 seed implant  ??? In 08/2017, PSA 3.45  ??? In 08/2018, PSA 5.92  ??? In 06/2020, PSA 17.6. Bone scan uptake in L 10, 11 rib, R iliac bone, corresponding to lytic lesions on CT.  ??? On 08/20/2020, started on ADT with Eligard 45 mg and a course of bicalutamide       Prostate cancer (CMS-HCC)   09/2015 Initial Diagnosis    Prostate cancer (CMS-HCC)     08/20/2020 Endocrine/Hormone Therapy    OP PROSTATE LEUPROLIDE (ELIGARD) 45 MG EVERY 6 MONTHS  Plan Provider: Maurie Boettcher, MD Malignant neoplasm metastatic to bone (CMS-HCC)   08/12/2020 Initial Diagnosis    Malignant neoplasm metastatic to bone (CMS-HCC)          I spent 15 minutes with LeonCindric in direct patient care.     Chilton Greathouse, PharmD    I was the precepting pharmacist in the delivery of services. I agree with the plan as documented.    Laverna Peace PharmD, BCOP, CPP  Hematology/Oncology Pharmacist  P: [phone]    PGY-2 Hematology/Oncology Pharmacy Resident

## 2020-11-12 MED FILL — PREDNISONE 5 MG TABLET: ORAL | 30 days supply | Qty: 30 | Fill #1

## 2020-11-12 MED FILL — ABIRATERONE 250 MG TABLET: ORAL | 15 days supply | Qty: 60 | Fill #2

## 2020-11-12 MED FILL — PREDNISONE 5 MG TABLET: 30 days supply | Qty: 30 | Fill #1 | Status: AC

## 2020-11-12 MED FILL — ABIRATERONE 250 MG TABLET: 15 days supply | Qty: 60 | Fill #2 | Status: AC

## 2020-11-18 NOTE — Unmapped (Signed)
[facility] Refill Coordination Note    Specialty Medication(s) to be Shipped:   Hematology/Oncology: abiraterone 250mg     Other medication(s) to be shipped: No additional medications requested for fill at this time     Leon Gonzalez, DOB: 02/01/1956  Phone: [phone] (home)       All above HIPAA information was verified with patient.     Was a Nurse, learning disability used for this call? No    Completed refill call assessment today to schedule patient's medication shipment from the [facility] [phone]).       Specialty medication(s) and dose(s) confirmed: Regimen is correct and unchanged.   Changes to medications: [name] reports no changes at this time.  Changes to insurance: No  Questions for the pharmacist: No    Confirmed patient received Welcome Packet with first shipment. The patient will receive a drug information handout for each medication shipped and additional FDA Medication Guides as required.       DISEASE/MEDICATION-SPECIFIC INFORMATION        N/A    SPECIALTY MEDICATION ADHERENCE     Medication Adherence    Patient reported X missed doses in the last month: 0  Specialty Medication: Abiraterone 250mg   Patient is on additional specialty medications: No  Informant: patient                Abiraterone 250 mg: 11 days of medicine on hand         SHIPPING     Shipping address confirmed in Epic.     Delivery Scheduled: Yes, Expected medication delivery date: 11/27/20.     Medication will be delivered via Next Day Courier to the prescription address in Epic [place].    Leon Gonzalez Leon Gonzalez   [facility] Specialty Technician

## 2020-11-24 NOTE — Unmapped (Signed)
Assessment/Plan:    Leon Gonzalez was seen today for follow-up.    Diagnoses and all orders for this visit:    Renal insufficiency  -     Basic Metabolic Panel    Ulcerative colitis without complications, unspecified location (CMS-HCC)    Gastroesophageal reflux disease, unspecified whether esophagitis present    Malignant neoplasm metastatic to bone (CMS-HCC)    Osteoarthritis, unspecified osteoarthritis type, unspecified site    Prostate cancer (CMS-HCC)    Primary insomnia  -     gabapentin (NEURONTIN) 100 MG capsule; 1 capsule at bedtime.    Hot flashes  -     gabapentin (NEURONTIN) 100 MG capsule; 1 capsule at bedtime.      1.  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  Patient had not been seen by urologist in a number of years.  Last seen in 2019.  He is on Viagra 50 mg as needed. He is off flomax and he states his urination is fair. He denies retention symptoms and he has 3-4 episodes of nocturia daily.    Last PSA in EMR from 08/2018 but repeat PSA in May/2021 was 14.  He was referred to urology for further evaluation.    His last visit with oncology was in November/2021 and follows noted from that encounter:   Assessment/ Plan:  Leon Gonzalez is a??64 y.o.??man with h/o Gleason 3+3=6 (8/12 cores positive) prostate cancer, s/p brachytherapy, now relapsing with bone mets at 3 sites with plans to initiate abiratrone/prednisone in addition to ADT therapy.??  1. Metastatic hormone sensitive prostate cancer, low volume disease- LFTs and Tbili WNL, K 3.8; clinic BP today 131/74. Reports hot flashes primarily at night, causing difficulty to fall asleep.  - Continue abiraterone 1000 mg daily and prednisone 5 mg daily  - S/p Eligard 45 mg q38mo on 08/21/20, next dose due on or after 02/16/21  - Check LFTs q2wks for the first 3 months after initiation of therapy??  2. Insomnia- reports continuous hot flashes that results in difficulty sleeping. Patient interested to start medication to help with sleep.  - Start OTC melatonin 3 mg PO nightly   - Consideration of gabapentin if worsening symptoms of both insomnia and hot flashes  3. Bone pain secondary to bone metastasis- continues to report pain in arms and legs. Takes Tylenol and Aleve as needed with relief.   - Continue tylenol up to 4000 mg PRN alternating with naprexon PRN for pain  - Reassess pain at f/u visit with CPP  4. Bone Health   - Continue calcium 600 mg BID and Vitamin D 1000 units QD  F/u: Dr. Philomena Course and CPP 12/05/20    He has an oncology follow up visit on 12/05/2020.  He is being followed closely for prostate cancer with bone mets.   His appetite is good despite taking oral chemo Rx.      ??  2.  GERD history without esophagitis: Patient was previously on omeprazole 20 mg daily and this has controlled his symptoms.  He has had no acute GI issues in the past year.    3.  Chronic pain in the setting of arthritis history and prostate CA with bony mets.  He is off opioids and has been for a long time.  He takes tylenol of aleve as needed.  He is followed by oncology and /cary orthopaedics and is having right hip total hip revision surgery in the  near future.   He had NM total body bone scan done in 07/2020 and the following is noted from that study:  IMPRESSION:  Abnormal focal increase in radiotracer uptake in the left 10th and 11th ribs and a very subtle increase in radiotracer uptake in the medial aspect of the right iliac bone, all corresponding to lytic changes on recent CT abdomen pelvis. These are worrisome for metastatic osseous lesions.    4.  Primary insomnia: Patient on Ambien 5 mg nightly as needed.  Previous regimen has been twice weekly. He has not needed this medication of late.  He has started OTC extended release melatonin instead.  He is up all the time with hot flashes and urinary frequency.   He is interested in starting gabapentin as noted on recent oncology visit- see above for detail. He is started on  Gabapentin 100 mg capsule, 1 before bed for hot flashes and insomnia but may also help with pain control.  We can increase dose if needed.        5.  History of  ulcerative colitis: Patient formally followed by Lynelle Smoke seen in 2017.  Medication regimen at that time included Asacol 800 mg twice daily. He stopped this due to cost. He denies abd pain, cramping pain, diarrhea or blood in stool.   He is appetite and weight have been stable.  Colonoscopy was done in 2016 and demonstrated minimal inflammation, biopsies taken at that time were negative.  Recommendation was for repeat colonoscopy, due in 2019. A Repeat   Colonoscopy was performed in November/2021 and revealed localized very mild inflammation in the distal rectum due to proctitis.  The examination was otherwise normal.  He was advised to repeat a colonoscopy in 10 years. He is having no acute GI issues off chronic medications for this condition.     6.  Renal insuff: CMP done in July/2021 revealed creatinine 1.69 and a repeat admission done in August/2021 was improved at 1.4.: Patient was referred for screening renal ultrasound which revealed enlarged prostate gland, stable appearing left renal cyst and kidney appearance consistent with medical renal disease.    Defer nephrology referral for the time being since creatinine is improving.  He is due for repeat serum creatinine level today. He takes 4-6 otc aleve a day for bone pain.  He is advised to stop daily  OTC aleve since it may be effecting your kidney function. Instead, Take maximum strength tylenol ; 650 mg tablet, up to 2 tablets three times a day , as needed for pain.  Gabapentin started and may also help with pain relief (see above). Repeat BMP today.       60 min pt encounter, greater then 50% counseling and coordination of care, review of medical history/records, recent specialists encounters, pre visit planning and post visit documentation.      Return in about 4 months (around 04/03/2021) for Recheck.    Subjective:     HPI  Patient is seen today for 77-month follow-up visit for the following HPI:    Ulcerative colitis without complications  Malignant neoplasm metastatic to bone   Gastroesophageal reflux disease  Prostate cancer  ED   Primary insomnia    He was advised then as follows:  1.  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  Patient had not been seen by urologist in a number of years.  Last seen in 2019.  He is  on Viagra 50 mg as needed. He is off flomax and he states his urination is fair. He denies retention symptoms and he has 3-4 episodes of nocturia daily.    Last PSA in EMR from 08/2018 but repeat PSA in May/2021 was 14.  He was referred to urology for further evaluation.    His last visit with oncology was in November/2021 and follows noted from that encounter:   Assessment/ Plan:  Leon Gonzalez is a??64 y.o.??man with h/o Gleason 3+3=6 (8/12 cores positive) prostate cancer, s/p brachytherapy, now relapsing with bone mets at 3 sites with plans to initiate abiratrone/prednisone in addition to ADT therapy.??  1. Metastatic hormone sensitive prostate cancer, low volume disease- LFTs and Tbili WNL, K 3.8; clinic BP today 131/74. Reports hot flashes primarily at night, causing difficulty to fall asleep.  - Continue abiraterone 1000 mg daily and prednisone 5 mg daily  - S/p Eligard 45 mg q46mo on 08/21/20, next dose due on or after 02/16/21  - Check LFTs q2wks for the first 3 months after initiation of therapy??  2. Insomnia- reports continuous hot flashes that results in difficulty sleeping. Patient interested to start medication to help with sleep.  - Start OTC melatonin 3 mg PO nightly   - Consideration of gabapentin if worsening symptoms of both insomnia and hot flashes  3. Bone pain secondary to bone metastasis- continues to report pain in arms and legs. Takes Tylenol and Aleve as needed with relief.   - Continue tylenol up to 4000 mg PRN alternating with naprexon PRN for pain  - Reassess pain at f/u visit with CPP  4. Bone Health   - Continue calcium 600 mg BID and Vitamin D 1000 units QD  F/u: Dr. Philomena Course and CPP 12/05/20    He has an oncology follow up visit on 12/05/2020.  He is being followed closely for prostate cancer with bone mets.   His appetite is good despite taking oral chemo Rx.      ??  2.  GERD history without esophagitis: Patient was previously on omeprazole 20 mg daily and this has controlled his symptoms.  He has had no acute GI issues in the past year.    3.  Chronic pain in the setting of arthritis history and prostate CA with bony mets.  He is off opioids and has been for a long time.  He takes tylenol of aleve as needed.  He is followed by oncology and [facility] orthopaedics and is having right hip total hip revision surgery in the near future.   He had NM total body bone scan done in 07/2020 and the following is noted from that study:  IMPRESSION:  Abnormal focal increase in radiotracer uptake in the left 10th and 11th ribs and a very subtle increase in radiotracer uptake in the medial aspect of the right iliac bone, all corresponding to lytic changes on recent CT abdomen pelvis. These are worrisome for metastatic osseous lesions.    4.  Primary insomnia: Patient on Ambien 5 mg nightly as needed.  Previous regimen has been twice weekly. He has not needed this medication of late.  He has started OTC extended release melatonin instead.  He is up all the time with hot flashes and urinary frequency.   He is interested in starting gabapentin as noted on recent oncology visit- see above for detail.     5.  Chronic ulcerative colitis: Patient formally followed by Lynelle Smoke seen in 2017.  Medication regimen at that  time included Asacol 800 mg twice daily. He stopped this due to cost. He denies abd pain, cramping pain, diarrhea or blood in stool.   He is appetite and weight have been stable.  Colonoscopy was done in 2016 and demonstrated minimal inflammation, biopsies taken at that time were negative.  Recommendation was for repeat colonoscopy, due in 2019. A Repeat   Colonoscopy was performed in November/2021 and revealed localized very mild inflammation in the distal rectum due to proctitis.  The examination was otherwise normal.  He was advised to repeat a colonoscopy in 10 years. He is having no acute GI issues off chronic medications for this condition.     6.  CMP done in July/2021 revealed creatinine 1.69 and a repeat admission done in August/2021 was improved at 1.4.: Patient was referred for screening renal ultrasound which revealed enlarged prostate gland, stable appearing left renal cyst and kidney appearance consistent with medical renal disease.    Defer nephrology referral for the time being since creatinine is improving.  He is due for repeat serum creatinine level today. He takes 4-6 otc aleve a day for bone pain.             ROS  Constitutional:  Denies  unexpected weight loss or gain, or weakness   Eyes:  Denies visual changes  Respiratory:  Denies cough or shortness of breath. No change in exercise  tolerance  Cardiovascular:  Denies chest pain, palpitations or lower extremity swelling   GI:  Denies abdominal pain, diarrhea, constipation   Musculoskeletal:  Denies myalgias  Skin:  Denies nonhealing lesions  Neurologic:  Denies headache, focal weakness or numbness, tingling  Endocrine:  Denies polyuria or polydypsia   Psychiatric:  Denies depression, anxiety      Outpatient Medications Prior to Visit   Medication Sig Dispense Refill   ??? abiraterone (ZYTIGA) 250 mg Tab tablet Take 4 tablets (1,000 mg total) by mouth daily. 120 tablet 11   ??? acetaminophen (TYLENOL) 325 MG tablet Take 325 mg by mouth every eight (8) hours as needed.     ??? calcium carbonate (OS-CAL) 1,500 mg (600 mg elem calcium) tablet Take 1 tablet by mouth Two (2) times a day.     ??? cholecalciferol, vitamin D3-25 mcg, 1,000 unit,, (VITAMIN D3) 25 mcg (1,000 unit) capsule Take 25 mcg by mouth daily.     ??? melatonin 3 mg Tab Take 3 mg by mouth every evening.     ??? naproxen sodium (ALEVE) 220 MG tablet Take 220 mg by mouth every six (6) hours as needed for pain.     ??? omeprazole (PRILOSEC) 20 MG capsule Take 20 mg by mouth daily as needed.     ??? predniSONE (DELTASONE) 5 MG tablet Take 1 tablet (5 mg total) by mouth daily. 30 tablet 11   ??? sildenafiL (VIAGRA) 50 MG tablet Take 50 mg by mouth.        No facility-administered medications prior to visit.         Objective:       Vital Signs  BP 118/76  - Pulse 84  - Temp 36.7 ??C (98.1 ??F) (Oral)  - Resp 16  - Ht 170.2 cm (5' 7)  - Wt 84.6 kg (186 lb 9.6 oz)  - SpO2 98%  - BMI 29.23 kg/m??      Exam  General: normal appearance  EYES: Anicteric sclerae.  ENT: Oropharynx moist.  RESP: Relaxed respiratory effort. Clear to auscultation without wheezes or crackles.  CV: Regular rate and rhythm. Normal S1 and S2. No murmurs or gallops.  No lower extremity edema. Posterior tibial pulses are 2+ and symmetric.  abd exam: non tender, no masses, no HSM   MSK: No focal muscle tenderness.  SKIN: Appropriately warm and moist.  NEURO: Stable gait and coordination.    Allergies:     Aspirin    Current Medications:     Current Outpatient Medications   Medication Sig Dispense Refill   ??? abiraterone (ZYTIGA) 250 mg Tab tablet Take 4 tablets (1,000 mg total) by mouth daily. 120 tablet 11   ??? acetaminophen (TYLENOL) 325 MG tablet Take 325 mg by mouth every eight (8) hours as needed.     ??? calcium carbonate (OS-CAL) 1,500 mg (600 mg elem calcium) tablet Take 1 tablet by mouth Two (2) times a day.     ??? cholecalciferol, vitamin D3-25 mcg, 1,000 unit,, (VITAMIN D3) 25 mcg (1,000 unit) capsule Take 25 mcg by mouth daily.     ??? melatonin 3 mg Tab Take 3 mg by mouth every evening.     ??? naproxen sodium (ALEVE) 220 MG tablet Take 220 mg by mouth every six (6) hours as needed for pain.     ??? omeprazole (PRILOSEC) 20 MG capsule Take 20 mg by mouth daily as needed.     ??? predniSONE (DELTASONE) 5 MG tablet Take 1 tablet (5 mg total) by mouth daily. 30 tablet 11   ??? sildenafiL (VIAGRA) 50 MG tablet Take 50 mg by mouth.      ??? gabapentin (NEURONTIN) 100 MG capsule 1 capsule at bedtime. 30 capsule 3     No current facility-administered medications for this visit.           Note - This record has been created using [employer]. Chart creation errors have been sought, but may not always have been located. Such creation errors do not reflect on the standard of medical care.    Jenell Milliner, MD

## 2020-11-26 MED FILL — ABIRATERONE 250 MG TABLET: ORAL | 15 days supply | Qty: 60 | Fill #3

## 2020-11-26 MED FILL — ABIRATERONE 250 MG TABLET: 15 days supply | Qty: 60 | Fill #3 | Status: AC

## 2020-11-27 ENCOUNTER — Encounter
Admit: 2020-11-27 | Discharge: 2020-11-28 | Payer: PRIVATE HEALTH INSURANCE | Attending: Physician Assistant | Primary: Physician Assistant

## 2020-11-27 ENCOUNTER — Encounter: Admit: 2020-11-27 | Discharge: 2020-11-28 | Payer: PRIVATE HEALTH INSURANCE

## 2020-11-27 ENCOUNTER — Ambulatory Visit: Admit: 2020-11-27 | Discharge: 2020-11-28 | Payer: PRIVATE HEALTH INSURANCE

## 2020-11-27 DIAGNOSIS — Z96641 Presence of right artificial hip joint: Principal | ICD-10-CM

## 2020-11-27 LAB — CBC W/ AUTO DIFF
BASOPHILS ABSOLUTE COUNT: 0 10*9/L (ref 0.0–0.1)
BASOPHILS RELATIVE PERCENT: 0.6 %
EOSINOPHILS ABSOLUTE COUNT: 0.2 10*9/L (ref 0.0–0.7)
EOSINOPHILS RELATIVE PERCENT: 2.4 %
HEMATOCRIT: 33.8 % — ABNORMAL LOW (ref 38.0–50.0)
HEMOGLOBIN: 11.9 g/dL — ABNORMAL LOW (ref 13.5–17.5)
LYMPHOCYTES ABSOLUTE COUNT: 1 10*9/L (ref 0.7–4.0)
LYMPHOCYTES RELATIVE PERCENT: 14.6 %
MEAN CORPUSCULAR HEMOGLOBIN CONC: 35.2 g/dL (ref 30.0–36.0)
MEAN CORPUSCULAR HEMOGLOBIN: 31.9 pg (ref 26.0–34.0)
MEAN CORPUSCULAR VOLUME: 90.6 fL (ref 81.0–95.0)
MEAN PLATELET VOLUME: 6.7 fL — ABNORMAL LOW (ref 7.0–10.0)
MONOCYTES ABSOLUTE COUNT: 0.4 10*9/L (ref 0.1–1.0)
MONOCYTES RELATIVE PERCENT: 6.2 %
NEUTROPHILS ABSOLUTE COUNT: 5 10*9/L (ref 1.7–7.7)
NEUTROPHILS RELATIVE PERCENT: 76.2 %
PLATELET COUNT: 243 10*9/L (ref 150–450)
RED BLOOD CELL COUNT: 3.74 10*12/L — ABNORMAL LOW (ref 4.32–5.72)
RED CELL DISTRIBUTION WIDTH: 13.5 % (ref 12.0–15.0)
WBC ADJUSTED: 6.5 10*9/L (ref 3.5–10.5)

## 2020-11-27 LAB — C-REACTIVE PROTEIN: C-REACTIVE PROTEIN: <4 mg/L (ref <=10.0)

## 2020-11-27 LAB — SEDIMENTATION RATE, MANUAL: ERYTHROCYTE SEDIMENTATION RATE: 8 mm/h (ref 0–20)

## 2020-11-27 NOTE — Unmapped (Signed)
ORTHOPAEDIC CLINIC NOTE      Leon Gonzalez, [place]       Patient Name: Leon Gonzalez  MRN: [id number]  DOB: 11-10-1956    Date of Evaluation: 11/27/2020    PCP: Jenell Milliner, MD      [facility] NEW CLINIC NOTE [id number])      ASSESSMENT:     [name] is a 64 y.o. male with the following diagnosis:      ICD-10-CM   1. S/P total right hip arthroplasty  Z96.641       PLAN:   The diagnosis and treatment plan were discussed.  X-rays were reviewed with the patient.  Reviewed the position of the acetabular cup.  I have asked for the patient to see if he can obtain previous x-rays obtained from his postoperative evaluations.  We will obtain lab work CBC with differential, ESR, CRP.  I will discuss his images with our total joint surgeons to discuss next steps.  - Follow-up plan: Return if symptoms worsen or fail to improve..      I reviewed the diagnosis and discussed treatment options with the patient. The patient is amenable to the above plan. The patient was instructed to call or return to clinic if symptoms fail to improve as expected, there is any increasing pain, or any other concerns.    MEDICAL DECISION MAKING (level of service defined by 2/3 elements)     Number/Complexity of Problems Addressed 1 stable chronic illness (99203/99213)   Amount/Complexity of Data to be Reviewed/Analyzed 2 points: Review prior notes (1 point per unique source); Review test results (1 point per unique test); Order tests (1 point per unique test) (99203/99213)   Risk of Complications/Morbidity/Mortality of Management Over-the-counter Medications (99203/99213)       SUBJECTIVE:     History of Present Illness:   Leon Gonzalez is a 64 y.o. male, PMH prostate cancer with bone metasasis seen for evaluation of their chief complaint of right hip pain.  He underwent a right total hip arthroplasty in 2015 at [facility] in [place] by Dr. Rosita Kea.  He had been doing very well until the onset of pain about 3 years ago.  He has noted persistent pain since that time that has worsened over the past few months.  He notes increased pain when he is operating his tractor.  He also notes increased pain with prolonged periods of standing and walking.      Medical History   Past Medical History:   Diagnosis Date   ??? Bone cancer (CMS-HCC)    ??? Peptic ulcer disease 1976   ??? Prostate cancer (CMS-HCC)    ??? Ulcerative colitis (CMS-HCC)         Surgical History   Past Surgical History:   Procedure Laterality Date   ??? BACK SURGERY  2005   ??? PR COLONOSCOPY FLX DX W/COLLJ SPEC WHEN PFRMD N/A 11/05/2020    Procedure: COLONOSCOPY, FLEXIBLE, PROXIMAL TO SPLENIC FLEXURE; DIAGNOSTIC, W/WO COLLECTION SPECIMEN BY BRUSH OR WASH;  Surgeon: [name], MD;  Location: [facility];  Service: Gastroenterology   ??? TOTAL HIP ARTHROPLASTY  2012        Allergies Aspirin   Medications   Current Outpatient Medications   Medication Sig Dispense Refill   ??? abiraterone (ZYTIGA) 250 mg Tab tablet Take 4 tablets (1,000 mg total) by mouth daily. 120 tablet 11   ??? acetaminophen (TYLENOL) 325 MG tablet Take 325 mg by mouth every eight (8) hours as  needed.     ??? calcium carbonate (OS-CAL) 1,500 mg (600 mg elem calcium) tablet Take 1 tablet by mouth Two (2) times a day.     ??? cholecalciferol, vitamin D3-25 mcg, 1,000 unit,, (VITAMIN D3) 25 mcg (1,000 unit) capsule Take 25 mcg by mouth daily.     ??? melatonin 3 mg Tab Take 3 mg by mouth every evening.     ??? naproxen sodium (ALEVE) 220 MG tablet Take 220 mg by mouth every six (6) hours as needed for pain.     ??? predniSONE (DELTASONE) 5 MG tablet Take 1 tablet (5 mg total) by mouth daily. 30 tablet 11   ??? sildenafiL (VIAGRA) 50 MG tablet Take 50 mg by mouth.        No current facility-administered medications for this visit.      Review of Systems Pertinent positives and negatives are documented in the HPI. All other systems reviewed are negative. Patient was instructed to follow-up with the appropriate provider as necessary for all pertinent positives not related to today's encounter.     Family History History reviewed. No pertinent family history.       Social History He reports that he has never smoked. He has never used smokeless tobacco. He reports current alcohol use of about 1.0 standard drink of alcohol per week. He reports that he does not use drugs.Home address:[address] [place] [address]        Occupational History   ??? Not on file    Employment:   Social History     Social History Narrative   ??? Not on file              OBJECTIVE:     DETAILED PHYSICAL EXAM  General Appearance Well developed, well-nourished in NAD   Mood and Affect Alert, Oriented       MUSCULOSKELETAL      [name] is a well appearing 64 y.o. male in no apparent distress.  He ambulates with an antalgic gait.  Exam of the right hip demonstrates well-healed surgical incision.  No swelling, ecchymosis, erythema, increased warmth.  No specific areas of palpable tenderness.  Painful, decreased range of motion of the right hip.  Most pain noted with internal and external rotation.  Mild diffuse palpable tenderness of the lumbar spine.  Full range of motion of lumbar spine without pain.  The foot is warm and well-perfused.  He has good capillary refill.  Distal pulses are equal bilaterally.      Test Results  Imaging  AP pelvis and two views of the right hip obtained today independently reviewed and interpreted by myself, demonstrate vertical positioning of the acetabular cup with otherwise no evidence of hardware complication.    Whole-body bone scan obtained 08/02/2020 demonstrates: Abnormal focal increase in radiotracer uptake in the left 10th and 11th ribs and a very subtle increase in radiotracer uptake in the medial aspect of the right iliac bone, all corresponding to lytic changes on recent CT abdomen pelvis. These are worrisome for metastatic osseous lesions    cc:  Jenell Milliner, MD

## 2020-11-27 NOTE — Unmapped (Signed)
Thank-you for coming to [facility]      ?? Recommendations/Plan  ?? Medications: Take over-the-counter medications as needed and as tolerated  ?? Labwork has been ordered  ?? Follow-up: I will call you with the results of your labwork and after I speak with our surgeons     ?? Contact my clinical staff, Jerrol Banana, ATC and Massie Bougie, ATC ,at [phone] for any questions/concerns. Voicemail is checked Monday-Friday from 8:00 am- 4:00 pm    ?? To schedule an appointment with Ketih Goodie, PA-C at [facility] please call: [phone]    ?? [facility], our orthopedic urgent care at [facility], is open Monday-Friday 8:00 am - 5:00 pm with no appointment necessary. Coverage is shared between [facility] and [employer].

## 2020-12-02 NOTE — Unmapped (Signed)
Spoke with patient re: hip. Case discussed with Drs Orlean Bradford and Daiva Huge. Will schedule patient for aspiration. Patient will obtain op note and previous x-ray. Will work on surgical clearance with Dr. Philomena Course with oncology

## 2020-12-03 ENCOUNTER — Encounter: Admit: 2020-12-03 | Discharge: 2020-12-04 | Payer: PRIVATE HEALTH INSURANCE

## 2020-12-03 DIAGNOSIS — N289 Disorder of kidney and ureter, unspecified: Principal | ICD-10-CM

## 2020-12-03 DIAGNOSIS — C61 Malignant neoplasm of prostate: Principal | ICD-10-CM

## 2020-12-03 DIAGNOSIS — F5101 Primary insomnia: Principal | ICD-10-CM

## 2020-12-03 DIAGNOSIS — R232 Flushing: Principal | ICD-10-CM

## 2020-12-03 DIAGNOSIS — M1990 Unspecified osteoarthritis, unspecified site: Principal | ICD-10-CM

## 2020-12-03 DIAGNOSIS — K219 Gastro-esophageal reflux disease without esophagitis: Principal | ICD-10-CM

## 2020-12-03 DIAGNOSIS — K5190 Ulcerative colitis, unspecified, without complications: Principal | ICD-10-CM

## 2020-12-03 DIAGNOSIS — C7951 Secondary malignant neoplasm of bone: Principal | ICD-10-CM

## 2020-12-03 LAB — BASIC METABOLIC PANEL
ANION GAP: 6 mmol/L (ref 5–14)
BLOOD UREA NITROGEN: 22 mg/dL (ref 9–23)
BUN / CREAT RATIO: 20
CALCIUM: 10.1 mg/dL (ref 8.7–10.4)
CHLORIDE: 102 mmol/L (ref 98–107)
CO2: 31 mmol/L (ref 20.0–31.0)
CREATININE: 1.12 mg/dL — ABNORMAL HIGH
EGFR CKD-EPI AA MALE: 80 mL/min/{1.73_m2} (ref >=60)
EGFR CKD-EPI NON-AA MALE: 69 mL/min/{1.73_m2} (ref >=60)
GLUCOSE RANDOM: 103 mg/dL (ref 70–179)
POTASSIUM: 4.2 mmol/L (ref 3.4–4.5)
SODIUM: 139 mmol/L (ref 135–145)

## 2020-12-03 MED ORDER — GABAPENTIN 100 MG CAPSULE
ORAL_CAPSULE | Freq: Every evening | ORAL | 3 refills | 0.00000 days | Status: CP
Start: 2020-12-03 — End: 2020-12-18

## 2020-12-03 NOTE — Unmapped (Signed)
Stop OTC aleve since it may be effecting your kidney function. Instead, Take maximum strength tylenol ; 650 mg tablet, up to 2 tablets three times a day , as needed for pain.  Start Gabapentin 100 mg capsule, 1 before bed for hot flashes and insomnia but may also help with pain control.  We can increase dose if needed.

## 2020-12-04 NOTE — Unmapped (Signed)
BMP is essentially normal with markedly improved and near normal creatinine of 1.12.

## 2020-12-05 ENCOUNTER — Encounter: Admit: 2020-12-05 | Discharge: 2020-12-06 | Payer: PRIVATE HEALTH INSURANCE

## 2020-12-05 ENCOUNTER — Encounter
Admit: 2020-12-05 | Discharge: 2020-12-06 | Payer: PRIVATE HEALTH INSURANCE | Attending: Hematology & Oncology | Primary: Hematology & Oncology

## 2020-12-05 DIAGNOSIS — K219 Gastro-esophageal reflux disease without esophagitis: Principal | ICD-10-CM

## 2020-12-05 DIAGNOSIS — Z79890 Hormone replacement therapy (postmenopausal): Principal | ICD-10-CM

## 2020-12-05 DIAGNOSIS — Z8042 Family history of malignant neoplasm of prostate: Principal | ICD-10-CM

## 2020-12-05 DIAGNOSIS — N289 Disorder of kidney and ureter, unspecified: Principal | ICD-10-CM

## 2020-12-05 DIAGNOSIS — C61 Malignant neoplasm of prostate: Principal | ICD-10-CM

## 2020-12-05 DIAGNOSIS — Z923 Personal history of irradiation: Principal | ICD-10-CM

## 2020-12-05 DIAGNOSIS — C7951 Secondary malignant neoplasm of bone: Principal | ICD-10-CM

## 2020-12-05 DIAGNOSIS — Z96641 Presence of right artificial hip joint: Principal | ICD-10-CM

## 2020-12-05 DIAGNOSIS — Z801 Family history of malignant neoplasm of trachea, bronchus and lung: Principal | ICD-10-CM

## 2020-12-05 DIAGNOSIS — N429 Disorder of prostate, unspecified: Principal | ICD-10-CM

## 2020-12-05 LAB — HEPATIC FUNCTION PANEL
ALBUMIN: 4 g/dL (ref 3.4–5.0)
ALKALINE PHOSPHATASE: 82 U/L (ref 46–116)
ALT (SGPT): 22 U/L (ref 10–49)
AST (SGOT): 23 U/L (ref <=34)
BILIRUBIN DIRECT: 0.1 mg/dL (ref 0.00–0.30)
BILIRUBIN TOTAL: 0.4 mg/dL (ref 0.3–1.2)
PROTEIN TOTAL: 7 g/dL (ref 5.7–8.2)

## 2020-12-05 LAB — POTASSIUM: POTASSIUM: 4.3 mmol/L (ref 3.5–5.1)

## 2020-12-05 LAB — PSA: PROSTATE SPECIFIC ANTIGEN: 0.05 ng/mL (ref 0.00–4.00)

## 2020-12-05 NOTE — Unmapped (Unsigned)
Labs drawn and sent for analysis.

## 2020-12-05 NOTE — Unmapped (Addendum)
Lab Results   Component Value Date    PSA 0.06 11/07/2020    PSA 0.59 10/03/2020    PSA 14.76 (H) 08/13/2020    PSA 17.62 (H) 07/16/2020   Lab draw in 3 weeks, around 12/26/2020  Return in about 6 weeks.    Please call [phone] to reach my nurse navigator Mauricia Area for any issues.    For emergencies on Nights, Weekends and Holidays  Call [phone] and ask for the hematology/oncology on call.    Griffin Basil, MD, PhD  Associate Professor of Medicine  Division of Hematology-Oncology    [facility]  Genitourinary Oncology Clinic  Nurse Navigator: Mauricia Area  Fax: [phone]

## 2020-12-05 NOTE — Unmapped (Signed)
[facility] Visit Note    Patient Name: Leon Gonzalez  Patient Age: 64 y.o.  Encounter Date: 12/05/2020  Attending Provider:  Mostafa Yuan E. Philomena Course, MD  Referring physician: Maurie Boettcher, MD    Assessment  Patient Active Problem List   Diagnosis   ??? Prostate cancer (CMS-HCC)   ??? Chronic hip pain   ??? 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 Primary insomnia   ??? Ulcerative colitis (CMS-HCC)   ??? DJD (degenerative joint disease)   ??? GERD (gastroesophageal reflux disease)   ??? 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 Renal insufficiency   ??? Hot flashes     1. Metastatic hormone sensitive prostate cancer, low volume disease.    A 64 y.o. man with h/o Gleason 3+3=6 (8/12 cores positive) prostate cancer, s/p brachytherapy, now relapsing with bone mets at 3 sites. This would be classified as low volume disease. Rad onc declined to give radiation to sites of mets.    Started on ADT with Eligard on 08/20/2020. In 09/2020, abiraterone/prednisone added.    Doing well, PSA down to 0.05. For R hip arthroplasty sequela, I don't believe this is cancer related and pt should be managed by orthopedics as appropriate.    Plan  1. Continue with ADT. Eligard 45 mg last given on 08/20/2020, next due on 4/31/2022 or later. Plan confirmed  2. Continue Abiraterone 1000 mg po daily and prednisone 5 mg po daily. Plan confirmed.  -- Check LFT's in 3 weeks, then q 4-6 weeks X 3 months.  -- Monitor BP regularly, at each visit and with PCP.  3. Somatic tumor sequencing not feasible, due to not being able to locate the tumor.  4. - At this visit, sent Misc DNA Sendout for custom [facility] prostate cancer panel, testing the following genes: BRCA1, BRCA2, ATM, BARD1, BRIP1, FANCA, CHEK2, HOXB13, MLH1, MSH2, MSH6, PMS2, EPCAM, PALB2, RAD51C, RAD51D, TP53   - Ordered E-consult to Adult Genetics--appreciate assistance with interpretation of results  5. Follow up with Ortho.  As far as I'm concerned, pt is cleared for any procedure with respect to R hip.  6. Return in 6 weeks or so. Lab visit in 3 weeks.    I personally spent 40 minutes face-to-face and non-face-to-face in the care of this patient, which includes all pre, intra, and post visit time on the date of service.          Reason for Visit  Follow up of prostate cancer    History of Present Illness:  Oncology History Overview Note   ??? In 08/2015, PSA 15.2. DRE cT1c  ??? In 09/2015, prostate biopsy 3+3=6, involving 8/12 biopsies.  ??? In 12/2015, brachytherapy with I-125 seed implant  ??? In 08/2017, PSA 3.45  ??? In 08/2018, PSA 5.92  ??? In 06/2020, PSA 17.6. Bone scan uptake in L 10, 11 rib, R iliac bone, corresponding to lytic lesions on CT.  ??? On 08/20/2020, started on ADT with Eligard 45 mg and a course of bicalutamide  ??? 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 Pt notes that he had been having R hip pain for the past several weeks and had been evaluated by [facility]. Pt had R total hip arthroplasty in 2015 and this may require follow up and possibly revision. Otherwise, pt is doing well and has had no problem tolerating abiraterone/prednisone.  No other issues noted.    Allergies:  Allergies   Allergen Reactions   ??? Aspirin Other (See Comments)     GI UPSET.  Stomach ulcer         Current Medications:    Current Outpatient Medications:   ???  abiraterone (ZYTIGA) 250 mg Tab tablet, Take 4 tablets (1,000 mg total) by mouth daily., Disp: 120 tablet, Rfl: 11  ???  acetaminophen (TYLENOL) 325 MG tablet, Take 325 mg by mouth every eight (8) hours as needed., Disp: , Rfl:   ???  calcium carbonate (OS-CAL) 1,500 mg (600 mg elem calcium) tablet, Take 1 tablet by mouth Two (2) times a day., Disp: , Rfl:   ???  cholecalciferol, vitamin D3-25 mcg, 1,000 unit,, (VITAMIN D3) 25 mcg (1,000 unit) capsule, Take 25 mcg by mouth daily., Disp: , Rfl:   ???  gabapentin (NEURONTIN) 100 MG capsule, Take 1 capsule (100 mg total) by mouth at bedtime., Disp: 30 capsule, Rfl: 3  ???  melatonin 3 mg Tab, Take 3 mg by mouth every evening., Disp: , Rfl:   ???  naproxen sodium (ALEVE) 220 MG tablet, Take 220 mg by mouth every six (6) hours as needed for pain., Disp: , Rfl:   ???  omeprazole (PRILOSEC) 20 MG capsule, Take 20 mg by mouth daily as needed., Disp: , Rfl:   ???  predniSONE (DELTASONE) 5 MG tablet, Take 1 tablet (5 mg total) by mouth daily., Disp: 30 tablet, Rfl: 11  ???  sildenafiL (VIAGRA) 50 MG tablet, Take 50 mg by mouth. , Disp: , Rfl:     Past Medical History and Social History  Past Medical History:   Diagnosis Date   ??? Bone cancer (CMS-HCC)    ??? Peptic ulcer disease 1976   ??? Prostate cancer (CMS-HCC)    ??? Ulcerative colitis (CMS-HCC)       Past Surgical History:   Procedure Laterality Date   ??? BACK SURGERY  2005   ??? PR COLONOSCOPY FLX DX W/COLLJ SPEC WHEN PFRMD N/A 11/05/2020    Procedure: COLONOSCOPY, FLEXIBLE, PROXIMAL TO SPLENIC FLEXURE; DIAGNOSTIC, W/WO COLLECTION SPECIMEN BY BRUSH OR WASH;  Surgeon: [name], MD;  [facility]: [facility];  Service: Gastroenterology   ??? TOTAL HIP ARTHROPLASTY  2012        Social History     Occupational History   ??? Not on file   Tobacco Use   ??? Smoking status: Never Smoker   ??? Smokeless tobacco: Never Used   Vaping Use   ??? Vaping Use: Never used   Substance and Sexual Activity   ??? Alcohol use: Yes     Alcohol/week: 1.0 standard drink     Types: 1 Shots of liquor per week   ??? Drug use: Never   ??? Sexual activity: Not on file       Family History    Prostate Cancer Family History Assessment:  ??? History of cancer in children (yes/no; if yes, what type AND age of diagnosis): No  ??? History of cancer in siblings (yes/no; if yes, provide relation, type of  cancer, AND age of diagnosis): Yes, brother with prostate cancer, age 25's  ??? History of cancer in parents (yes/no; if yes, please specify parent, type of cancer, AND age of diagnosis): Yes, father with prostate cancer, age 59's. mother with lung cancer, age 45  ??? History of cancer in aunts/uncles/grandparents (yes/no; if yes, provide relation, type of cancer, AND age of diagnosis): No      Review of Systems:  A comprehensive review of 10 systems was negative except for pertinent positives noted in HPI.    Physical Exam:    VITAL SIGNS:  BP 147/85  - Pulse 88  - Resp 18  - Ht 170.2 cm (5' 7)  - Wt 84.5 kg (186 lb 3.2 oz)  - SpO2 99%  - BMI 29.16 kg/m??   ECOG Performance Status: 1  GENERAL: Well-developed, well-nourished patient in no acute distress.  HEAD: Normocephalic and atraumatic.  EYES: Conjunctivae are normal. No scleral icterus.  MOUTH/THROAT: Oropharynx is clear and moist.  No mucosal lesions.  NECK: Supple, no thyromegaly.  LYMPHATICS: No palpable cervical, supraclavicular, or axillary adenopathy.  CARDIOVASCULAR: Normal rate, regular rhythm and normal heart sounds.  Exam reveals no gallop and no friction rub.  No murmur heard.  PULMONARY/CHEST: Effort normal and breath sounds normal. No respiratory distress.  ABDOMINAL:  Soft. There is no distension. There is no tenderness. There is no rebound and no guarding.  MUSCULOSKELETAL: No clubbing, cyanosis, or lower extremity edema.  PSYCHIATRIC: Alert and oriented.  Normal mood and affect.  NEUROLOGIC: No focal motor deficit. Normal gait.  SKIN: Skin is warm, dry, and intact.      Results/Orders:    Lab on 12/05/2020   Component Date Value Ref Range Status   ??? PSA 12/05/2020 0.05  0.00 - 4.00 ng/mL Final   ??? Potassium 12/05/2020 4.3  3.5 - 5.1 mmol/L Final   ??? Albumin 12/05/2020 4.0  3.4 - 5.0 g/dL Final   ??? Total Protein 12/05/2020 7.0  5.7 - 8.2 g/dL Final   ??? Total Bilirubin 12/05/2020 0.4  0.3 - 1.2 mg/dL Final   ??? Bilirubin, Direct 12/05/2020 0.10  0.00 - 0.30 mg/dL Final   ??? AST 16/09/9603 23  <=34 U/L Final   ??? ALT 12/05/2020 22  10 - 49 U/L Final   ??? Alkaline Phosphatase 12/05/2020 82  46 - 116 U/L Final   ??? DNA Test Name 12/05/2020 SEE COMMENT   Preliminary    Custom Prostate Cancer Panel (ATM, BARD1, BRCA1, BRCA2, BRIP1, CHEK2, EPCAM, FANCA, HOXB13, MLH1, MSH2, MSH6, PALB2, PMS2, RAD51C, RAD51D, TP53) sent to [facility].     Office Visit on 12/03/2020   Component Date Value Ref Range Status   ??? Sodium 12/03/2020 139  135 - 145 mmol/L Final   ??? Potassium 12/03/2020 4.2  3.4 - 4.5 mmol/L Final   ??? Chloride 12/03/2020 102  98 - 107 mmol/L Final   ??? CO2 12/03/2020 31.0  20.0 - 31.0 mmol/L Final   ??? Anion Gap 12/03/2020 6  5 - 14 mmol/L Final   ??? BUN 12/03/2020 22  9 - 23 mg/dL Final   ??? Creatinine 12/03/2020 1.12* 0.60 - 1.10 mg/dL Final   ??? BUN/Creatinine Ratio 12/03/2020 20   Final   ??? EGFR CKD-EPI Non-African American,* 12/03/2020 69  >=60 mL/min/1.21m2 Final   ??? EGFR CKD-EPI African American, Male 12/03/2020 80  >=60 mL/min/1.8m2 Final   ??? Glucose 12/03/2020 103  70 - 179 mg/dL Final   ??? Calcium 54/08/8118 10.1  8.7 - 10.4 mg/dL Final   Appointment on 11/27/2020   Component Date Value Ref Range Status   ??? Sed Rate 11/27/2020 8  0 - 20 mm/h Final   ??? CRP 11/27/2020 <4.0  <=10.0 mg/L Final   Office Visit on 11/27/2020   Component Date Value Ref Range Status   ??? WBC 11/27/2020 6.5  3.5 - 10.5 10*9/L Final   ??? RBC 11/27/2020 3.74* 4.32 - 5.72 10*12/L Final   ??? HGB 11/27/2020 11.9* 13.5 - 17.5 g/dL Final   ??? HCT 91/47/8295 33.8* 38.0 - 50.0 % Final   ??? MCV 11/27/2020 90.6  81.0 - 95.0 fL Final   ??? MCH 11/27/2020 31.9  26.0 - 34.0 pg Final   ??? MCHC 11/27/2020 35.2  30.0 - 36.0 g/dL Final   ??? RDW 62/13/0865 13.5  12.0 - 15.0 % Final   ??? MPV 11/27/2020 6.7* 7.0 - 10.0 fL Final   ??? Platelet 11/27/2020 243  150 - 450 10*9/L Final   ??? Neutrophils % 11/27/2020 76.2  % Final   ??? Lymphocytes % 11/27/2020 14.6  % Final   ??? Monocytes % 11/27/2020 6.2  % Final   ??? Eosinophils % 11/27/2020 2.4  % Final   ??? Basophils % 11/27/2020 0.6  % Final   ??? Absolute Neutrophils 11/27/2020 5.0  1.7 - 7.7 10*9/L Final   ??? Absolute Lymphocytes 11/27/2020 1.0  0.7 - 4.0 10*9/L Final   ??? Absolute Monocytes 11/27/2020 0.4  0.1 - 1.0 10*9/L Final   ??? Absolute Eosinophils 11/27/2020 0.2  0.0 - 0.7 10*9/L Final   ??? Absolute Basophils 11/27/2020 0.0  0.0 - 0.1 10*9/L Final       Lab Results   Component Value Date    PSA 0.05 12/05/2020    PSA 0.06 11/07/2020    PSA 0.59 10/03/2020    PSA 14.76 (H) 08/13/2020    PSA 17.62 (H) 07/16/2020             Orders placed or performed in visit on 12/05/20   ??? Clinic Appointment Request Physician, Lab   ??? Clinic Appointment Request Physician   ??? LAB Appointment Request           Molecular Pathology  Tumor mutation profiling  Unable to locate specimen    Germline testing  N/A    Pathology  Gleason 3+3=6, 8/12 cores in 2016    Imaging results:  CT AP on 08/01/2020  -Fiducial markers within the prostate gland. Known malignancy is not well demonstrated on current exam.  ??  -Few subcentimeter right external iliac nodes are identified. Attention is recommended on subsequent examinations  ??  - Hypoattenuating lesions of the pancreatic neck, indeterminate and may possibly reflect interdigitating fat versus IPMNs, too small to characterize. Nonemergent MRI/MRCP can better assess. This can also assess subcentimeter hepatic lesions described above.  ??  -Osseous findings are better evaluated on concurrent bone scan    Bone scan 08/01/2020  Abnormal focal increase in radiotracer uptake in the left 10th and 11th ribs and a very subtle increase in radiotracer uptake in the medial aspect of the right iliac bone, all corresponding to lytic changes on recent CT abdomen pelvis. These are worrisome for metastatic osseous lesions.

## 2020-12-06 NOTE — Unmapped (Signed)
Left message for pt that his labs from yesterday were good.Notified pharmacist Katina Degree and she did not try to reach him either. Hopefully whoever tried to call him will call back.Ask him to call back with any further questions or concerns.

## 2020-12-06 NOTE — Unmapped (Signed)
Hi,     Patient Leon Gonzalez contacted the [facility] today stating that he had received a phone call from the nurse navigator or someone about his blood work results. Please contact patient back at [phone].     .Thank you,   Noland Fordyce  [facility]  [phone]

## 2020-12-11 NOTE — Unmapped (Signed)
[facility] Refill Coordination Note    Specialty Medication(s) to be Shipped:   Hematology/Oncology: abiraterone 250mg  and Transplant: Prednisone 5mg     Other medication(s) to be shipped: No additional medications requested for fill at this time     Carlisle Enke, DOB: Sep 24, 1956  Phone: [phone] (home)       All above HIPAA information was verified with patient.     Was a Nurse, learning disability used for this call? No    Completed refill call assessment today to schedule patient's medication shipment from the [facility] [phone]).       Specialty medication(s) and dose(s) confirmed: Regimen is correct and unchanged.   Changes to medications: Ritesh reports no changes at this time.  Changes to insurance: No  Questions for the pharmacist: No    Confirmed patient received Welcome Packet with first shipment. The patient will receive a drug information handout for each medication shipped and additional FDA Medication Guides as required.       DISEASE/MEDICATION-SPECIFIC INFORMATION        N/A    SPECIALTY MEDICATION ADHERENCE     Medication Adherence    Patient reported X missed doses in the last month: 0  Specialty Medication: Abiraterone 250mg   Patient is on additional specialty medications: Yes  Additional Specialty Medications: Prednisone 5mg   Patient Reported Additional Medication X Missed Doses in the Last Month: 0  Patient is on more than two specialty medications: No  Informant: patient                Abiraterone 250 mg: 6 days of medicine on hand   Prednisone 5 mg: 6 days of medicine on hand         SHIPPING     Shipping address confirmed in Epic.     Delivery Scheduled: Yes, Expected medication delivery date: 12/12/20.     Medication will be delivered via Same Day Courier to the prescription address in Epic [place].    [name]   [facility] Specialty Technician

## 2020-12-12 MED FILL — PREDNISONE 5 MG TABLET: ORAL | 30 days supply | Qty: 30 | Fill #2

## 2020-12-12 MED FILL — PREDNISONE 5 MG TABLET: 30 days supply | Qty: 30 | Fill #2 | Status: AC

## 2020-12-12 MED FILL — ABIRATERONE 250 MG TABLET: 30 days supply | Qty: 120 | Fill #4 | Status: AC

## 2020-12-12 MED FILL — ABIRATERONE 250 MG TABLET: ORAL | 30 days supply | Qty: 120 | Fill #4

## 2020-12-16 ENCOUNTER — Encounter
Admit: 2020-12-16 | Discharge: 2020-12-17 | Payer: PRIVATE HEALTH INSURANCE | Attending: Internal Medicine | Primary: Internal Medicine

## 2020-12-16 DIAGNOSIS — Z96641 Presence of right artificial hip joint: Principal | ICD-10-CM

## 2020-12-16 MED ORDER — TRAMADOL 50 MG TABLET
ORAL_TABLET | Freq: Four times a day (QID) | ORAL | 0 refills | 3.00000 days | Status: CP | PRN
Start: 2020-12-16 — End: 2020-12-30

## 2020-12-16 NOTE — Unmapped (Signed)
ORTHOPAEDIC FOLLOW UP CLINIC NOTE     Leon Gonzalez is seen in consultation at the request of April Jaquelyn Bitter, Pa  [address]  Cb [phone]; [address]  [place],  [place] [address] For evaluation of No chief complaint on file.       ASSESSMENT:  Leon Gonzalez is a 64 y.o. male with:  1. S/P total right hip arthroplasty         PLAN:  Performed aspiration of right prosthetic hip.  Required injection of sterile saline to obtain fluid.  This will be sent for [facility] analysis.  Provided with short course of pain medicine for post procedure pain    Procedure(s):    PROCEDURE NOTE: Hip Aspiration right    Consent   After discussing the various treatment options for the condition, It was agreed that a Hip aspiration would be required to rule out an occult infection.   The nature of and the indications for the aspiration were reviewed in detail with the patient today. The inherent risks of injection including infection, bleeding, nerve injury and/or infecton were discussed.     Procedure :  Hip aspiration.  The risks and benefits of the procedure were explained,verbal consent was given, and a procedural time-out was performed. The Hip joint and surrounding structures were visualized with ultrasound.  The site for the aspiration was properly marked and prepped with Chlorhexadine solution.  The injection site was anesthetized with ethyl chloride and 4cc of 2% Lidocaine with a 25 gauge 2 inch needle. Using ultrasound guidance, the Hip joint was visualized and aspirated.  A total of 1 cc of serosanguineous fluid were obtained    NEED FOR SONOGRAPHIC GUIDANCE    Given the complexity of this problem, the anatomic location of this structure, sonographic guidance is recommended to prevent injury to neurovascular structures and confirm accuracy of needle placement. The accuracy of doing these procedures blind is poor and the benefit to the patient by using ultrasound guidance is significant to avoid complications. Reference:  American Medical Society for Sports Medicine (AMSSM) position statement: interventional musculoskeletal ultrasound in sports medicine.  Morey Hummingbird MM, Adams E, Berkoff D, Concoff AL, [employer] Sports Med. 2014 Oct 20. pii: [id number]. doi: 10.1136/bjsports-[id number]  Post procedure a sterile band-aide was applied. Post-injection instructions were given regarding post-procedure care, when to follow up in clinic and what to expect from the procedure. The patient tolerated the injection well and was discharged without complication.            Follow Up: Return if symptoms worsen or fail to improve.       SUBJECTIVE:  Chief Complaint:  No chief complaint on file.        History of Present Illness:   64 y.o. male who has right hip pain coming for follow up evaluation.  Referred by April for right hip aspiration due to continued pain in a replaced joint.    Accompanied by his son's girlfriend    All relevant orthopedic medical chart documents reviewed.    Changes to medical history since last clinic visit per patient questionnaire  None.   Surgical History   Past Surgical History:   Procedure Laterality Date   ??? BACK SURGERY  2005   ??? PR COLONOSCOPY FLX DX W/COLLJ SPEC WHEN PFRMD N/A 11/05/2020    Procedure: COLONOSCOPY, FLEXIBLE, PROXIMAL TO SPLENIC FLEXURE; DIAGNOSTIC, W/WO COLLECTION SPECIMEN BY BRUSH OR WASH;  Surgeon: [name],  MD;  Location: [facility];  Service: Gastroenterology   ??? TOTAL HIP ARTHROPLASTY  2012      Medications   Current Outpatient Medications   Medication Sig Dispense Refill   ??? abiraterone (ZYTIGA) 250 mg Tab tablet Take 4 tablets (1,000 mg total) by mouth daily. 120 tablet 11   ??? acetaminophen (TYLENOL) 325 MG tablet Take 325 mg by mouth every eight (8) hours as needed.     ??? calcium carbonate (OS-CAL) 1,500 mg (600 mg elem calcium) tablet Take 1 tablet by mouth Two (2) times a day.     ??? cholecalciferol, vitamin D3-25 mcg, 1,000 unit,, (VITAMIN D3) 25 mcg (1,000 unit) capsule Take 25 mcg by mouth daily.     ??? gabapentin (NEURONTIN) 100 MG capsule Take 1 capsule (100 mg total) by mouth at bedtime. 30 capsule 3   ??? melatonin 3 mg Tab Take 3 mg by mouth every evening.     ??? naproxen sodium (ALEVE) 220 MG tablet Take 220 mg by mouth every six (6) hours as needed for pain.     ??? omeprazole (PRILOSEC) 20 MG capsule Take 20 mg by mouth daily as needed.     ??? predniSONE (DELTASONE) 5 MG tablet Take 1 tablet (5 mg total) by mouth daily. 30 tablet 11   ??? sildenafiL (VIAGRA) 50 MG tablet Take 50 mg by mouth.      ??? traMADoL (ULTRAM) 50 mg tablet Take 1 tablet (50 mg total) by mouth every six (6) hours as needed for pain,severe (7-10) for up to 10 doses. 10 tablet 0     No current facility-administered medications for this visit.      Allergies   Aspirin       Review of Systems  .   Marland Kitchen         OBJECTIVE:  DETAILED PHYSICAL EXAM  General Appearance ?? well-nourished and no acute distress   Mood and Affect ?? alert, cooperative and pleasant   Gait and Station ?? Smooth, heel-toe, non-antalgic gait   Cardiovascular ?? well-perfused distally and no swelling   Skin ?? Normal.   Lymph ?? No adenopathy or edema noted   Respiratory ?? Non labored breathing   Sensation ?? Sensation intact to light touch distally       MUSCULO-  SKELETAL    Right Hip ?? Inspection: No swelling, erythema, deformity, atrophy or hypertrophy noted  ??        Test and Notes Review:    Imaging reviewed: X-rays of hip and Labs reviewed: ESR, CRP, CBC    This note was dictated using Scientist, clinical (histocompatibility and immunogenetics).  Please excuse any spelling or grammatical errors.      MEDICAL DECISION MAKING (level of service defined by 2/3 elements)     Number/Complexity of Problems Addressed 1 stable chronic illness (99203/99213)   Amount/Complexity of Data to be Reviewed/Analyzed 2 points: Review prior notes (1 point per unique source); Review test results (1 point per unique test); Order tests (1 point per unique test) (99203/99213)   Risk of Complications/Morbidity/Mortality of Management --LOW Risk of Morbidity from Additional Diagnostic Testing or Treatment (99203/99213)--     TIME     Total Time for E/M Services on the Date of Encounter 99213 - I personally spent 20-29 minutes face-to-face and non-face-to-face in the care of this patient, excluding time spent during separately reported procedures, but including all pre, intra, and post visit time on the date of service.

## 2020-12-16 NOTE — Unmapped (Signed)
Thank you for coming to the [facility] and our clinic today!     We aim to provide you with the highest quality care.  If you need to schedule future appointments please call [phone].  If you need more direct help feel free to call our nurse line at [phone] or use MyChart to reach out to [place].    We look forward to seeing you again in the future and appreciate you coming to [facility].    Thank you.    Saunders Glance. Enis Slipper, MD            We provide innovative and comprehensive patient centered care that is supported by evidence-based research                                                                                                    COMING SOON!    Please check out our current research studies to see if you or someone you know may qualify at:    [email or web address]    .

## 2020-12-17 DIAGNOSIS — R232 Flushing: Principal | ICD-10-CM

## 2020-12-17 DIAGNOSIS — F5101 Primary insomnia: Principal | ICD-10-CM

## 2020-12-17 MED ORDER — GABAPENTIN 100 MG CAPSULE
ORAL_CAPSULE | Freq: Every evening | ORAL | 3 refills | 30 days
Start: 2020-12-17 — End: ?

## 2020-12-17 NOTE — Unmapped (Signed)
Patient is requesting the following refill  Requested Prescriptions     Pending Prescriptions Disp Refills   ??? gabapentin (NEURONTIN) 100 MG capsule 30 capsule 3     Sig: Take 1 capsule (100 mg total) by mouth at bedtime.       Last OV: 12/03/2020     Last Virtual Visit: Visit date not found     Next OV: Visit date not found.     Labs: Not applicable this refill    cancelled per pharmacy

## 2020-12-18 MED ORDER — GABAPENTIN 100 MG CAPSULE
ORAL_CAPSULE | Freq: Every evening | ORAL | 3 refills | 30 days | Status: CP
Start: 2020-12-18 — End: 2021-12-18

## 2020-12-27 DIAGNOSIS — C7951 Secondary malignant neoplasm of bone: Principal | ICD-10-CM

## 2020-12-27 DIAGNOSIS — C61 Malignant neoplasm of prostate: Principal | ICD-10-CM

## 2020-12-28 NOTE — Unmapped (Signed)
Thank you for your e-Consult.    To summarize:   Mr. Leon Gonzalez is an 65 y.o. male seen for a personal history of metastatic hormone sensitive prostate cancer prostate cancer. His family history is notable for one brother with prostate cancer, age 37's and father with prostate cancer in his 71s. Having two first degree relatives with prostate cancer does increase Leon Gonzalez's risk for cancer himself and may suggest a hereditary cancer syndrome, thus genetic testing was recommended.  Leon Gonzalez test, which was performed at [facility], included BRCA1, BRCA2, ATM, BARD1, BRIP1, FANCA, CHEK2, HOXB13, MLH1, MSH2, MSH6, PMS2, EPCAM, PALB2, RAD51C, RAD51D, TP53, and shows no reportable variants.     My assessment is:   Testing was normal. It did not identify any pathogenic or likely pathogenic variants, or variants of unknown significance associated with prostate cancer predisposition. This suggests that the probability of a hereditary cancer predisposition syndrome is very low.    Family members may still be at increased risk to develop various cancers, based simply on the family history of cancers. Changes in Leon Gonzalez personal or family history might alter our assessment of the chance that his cancer or those in the family were due to a hereditary factor. In addition, future advances in genetic testing might lead to additional ways to assess cancer risk in patients with negative genetic testing for Mendelian cancer predisposition syndromes.    My recommendations are:   1. We do not recommend additional germline genetic testing in Leon Gonzalez at this time  2. Family members may wish to discuss the family history with their providers to determine appropriate cancer screening  3. Further consultation with the [facility] is available if needed in the future    I spent 5-10 minutes in medical consultative discussion and review of medical records, including a written report to the treating provider via electronic health record regarding the condition of this patient.  This e-Consult did include an answerable clinical question and did not recommend a clinic visit.    Ashlynn Messmore, CGC  Ronalee Belts, MD    The recommendations provided in this eConsult are based on the clinical data available to me and are furnished without the benefit of a comprehensive in-person evaluation of the patient. Any new clinical issues or changes in patient status not available to me will need to be taken into account when assessing these recommendations. The ongoing management of this patient is the responsibility of the referring clinician. Please contact me if you have further questions.

## 2020-12-30 MED ORDER — TRAMADOL 50 MG TABLET
ORAL_TABLET | Freq: Four times a day (QID) | ORAL | 0 refills | 5 days | Status: CP | PRN
Start: 2020-12-30 — End: ?

## 2020-12-30 MED ORDER — CELECOXIB 200 MG CAPSULE
ORAL_CAPSULE | Freq: Two times a day (BID) | ORAL | 1 refills | 30 days | Status: CP
Start: 2020-12-30 — End: 2021-01-29

## 2021-01-07 NOTE — Unmapped (Signed)
The [facility] has made a second and final attempt to reach this patient to refill the following medication:Abiraterone.      We have left voicemails on the following phone numbers: [phone].    Dates contacted: 1/11,18  Last scheduled delivery: 12/12/20    The patient may be at risk of non-compliance with this medication. The patient should call the [facility] at [phone] (option 4) to refill medication.    Wyatt Mage Hulda Humphrey   [facility] Specialty Technician

## 2021-01-09 NOTE — Unmapped (Signed)
[facility] Refill Coordination Note    Specialty Medication(s) to be Shipped:   Hematology/Oncology: abiraterone 250mg  and Prednisone 5mg     Other medication(s) to be shipped: No additional medications requested for fill at this time     Leon Gonzalez, DOB: 11/27/1956  Phone: [phone] (home)       All above HIPAA information was verified with patient.     Was a Nurse, learning disability used for this call? No    Completed refill call assessment today to schedule patient's medication shipment from the [facility] [phone]).       Specialty medication(s) and dose(s) confirmed: Regimen is correct and unchanged.   Changes to medications: [name] reports no changes at this time.  Changes to insurance: No  Questions for the pharmacist: No    Confirmed patient received Welcome Packet with first shipment. The patient will receive a drug information handout for each medication shipped and additional FDA Medication Guides as required.       DISEASE/MEDICATION-SPECIFIC INFORMATION        N/A    SPECIALTY MEDICATION ADHERENCE     Medication Adherence    Patient reported X missed doses in the last month: 0  Specialty Medication: abiraterone 250mg   Patient is on additional specialty medications: Yes  Additional Specialty Medications: Prednisone 5mg   Patient Reported Additional Medication X Missed Doses in the Last Month: 0                abiraterone 250mg   : 7 days of medicine on hand   Prednisone 5mg   : 7 days of medicine on hand         SHIPPING     Shipping address confirmed in Epic.     Delivery Scheduled: Yes, Expected medication delivery date: 1/25.     Medication will be delivered via Next Day Courier to the prescription address in Epic [place].    Leon Gonzalez   [facility] Specialty Technician

## 2021-01-13 DIAGNOSIS — C61 Malignant neoplasm of prostate: Principal | ICD-10-CM

## 2021-01-14 MED FILL — ABIRATERONE 250 MG TABLET: ORAL | 30 days supply | Qty: 120 | Fill #5

## 2021-01-14 MED FILL — PREDNISONE 5 MG TABLET: ORAL | 30 days supply | Qty: 30 | Fill #3

## 2021-01-15 NOTE — Unmapped (Signed)
Hi,    Patient Leon Gonzalez contacted the Communication Center to cancel their appointment for today.  The appointment has been cancelled.    Cancellation Reason: Financial Reasons    Thank you,  Drema Balzarine  [facility]   [phone]

## 2021-01-15 NOTE — Unmapped (Signed)
Attempted to call patient.  Left message on machine.  MyChart messages have also been sent inquiring if patient was able to obtain copy of old x-ray and operative report for review.  Patient instructed to either reply back via MyChart or to call us for a good time he can be reached

## 2021-01-20 MED ORDER — TRAMADOL 50 MG TABLET
ORAL_TABLET | 0 refills | 0 days
Start: 2021-01-20 — End: ?

## 2021-01-21 NOTE — Unmapped (Signed)
Pt called stating they would like an antibiotic for a sinus infection. Pt  reports having a fever of 104, bad headache and congestion. This nurse advised pt be seen in urgent care  For further evaluation as it is the end of the day and we are not able to see in clinic.pt agreeable to plan

## 2021-02-10 NOTE — Unmapped (Signed)
[facility] Refill Coordination Note    Specialty Medication(s) to be Shipped:   Hematology/Oncology: abiraterone 250mg  and Transplant: Prednisone 5mg     Other medication(s) to be shipped: No additional medications requested for fill at this time     Leon Gonzalez, DOB: 01-18-1956  Phone: [phone] (home)       All above HIPAA information was verified with patient.     Was a Nurse, learning disability used for this call? No    Completed refill call assessment today to schedule patient's medication shipment from the [facility] [phone]).       Specialty medication(s) and dose(s) confirmed: Regimen is correct and unchanged.   Changes to medications: [name] reports no changes at this time.  Changes to insurance: No  Questions for the pharmacist: No    Confirmed patient received Welcome Packet with first shipment. The patient will receive a drug information handout for each medication shipped and additional FDA Medication Guides as required.       DISEASE/MEDICATION-SPECIFIC INFORMATION        N/A    SPECIALTY MEDICATION ADHERENCE     Medication Adherence    Specialty Medication: Abiraterone 250mg   Patient is on additional specialty medications: No  Patient is on more than two specialty medications: No                Abiraterone 250 mg: 7 days of medicine on hand   Prednisone 5 mg: 7 days of medicine on hand         SHIPPING     Shipping address confirmed in Epic.     Delivery Scheduled: Yes, Expected medication delivery date: 02/14/21.     Medication will be delivered via Next Day Courier to the prescription address in Epic [place].    [name]   [facility] Specialty Technician

## 2021-02-13 ENCOUNTER — Encounter
Admit: 2021-02-13 | Discharge: 2021-02-14 | Payer: PRIVATE HEALTH INSURANCE | Attending: Hematology & Oncology | Primary: Hematology & Oncology

## 2021-02-13 ENCOUNTER — Encounter: Admit: 2021-02-13 | Discharge: 2021-02-14 | Payer: PRIVATE HEALTH INSURANCE

## 2021-02-13 DIAGNOSIS — C61 Malignant neoplasm of prostate: Principal | ICD-10-CM

## 2021-02-13 DIAGNOSIS — C7951 Secondary malignant neoplasm of bone: Principal | ICD-10-CM

## 2021-02-13 LAB — HEPATIC FUNCTION PANEL
ALBUMIN: 3.6 g/dL (ref 3.4–5.0)
ALKALINE PHOSPHATASE: 85 U/L (ref 46–116)
ALT (SGPT): 21 U/L (ref 10–49)
AST (SGOT): 21 U/L (ref <=34)
BILIRUBIN DIRECT: <0.1 mg/dL (ref 0.00–0.30)
BILIRUBIN TOTAL: 0.3 mg/dL (ref 0.3–1.2)
PROTEIN TOTAL: 6.6 g/dL (ref 5.7–8.2)

## 2021-02-13 LAB — POTASSIUM: POTASSIUM: 4.2 mmol/L (ref 3.5–5.1)

## 2021-02-13 LAB — PSA: PROSTATE SPECIFIC ANTIGEN: <0.04 ng/mL (ref 0.00–4.00)

## 2021-02-13 NOTE — Unmapped (Signed)
Leon Gonzalez 's Entire shipment will be canceled  as a result of MAPs seeking manufacture assistance.     I have reached out to the patient and communicated the delivery change. We will not reschedule the medication and have removed this/these medication(s) from the work request.  We have canceled this work request.

## 2021-02-13 NOTE — Unmapped (Unsigned)
[facility] Visit Note    Patient Name: Leon Gonzalez  Patient Age: 65 y.o.  Encounter Date: 02/13/2021  Attending Provider:  Marlayna Bannister E. Philomena Course, MD  Referring physician: Maurie Boettcher, MD    Assessment  Patient Active Problem List   Diagnosis   ??? Prostate cancer (CMS-HCC)   ??? Chronic hip pain   ??? 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 Primary insomnia   ??? Ulcerative colitis (CMS-HCC)   ??? DJD (degenerative joint disease)   ??? GERD (gastroesophageal reflux disease)   ??? 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 Renal insufficiency   ??? Hot flashes     1. Metastatic hormone sensitive prostate cancer, low volume disease.    A 65 y.o. man with h/o Gleason 3+3=6 (8/12 cores positive) prostate cancer in 2016, s/p brachytherapy, now relapsing with bone mets at 3 sites. This would be classified as low volume disease. Rad onc declined to give radiation to sites of mets.    Started on ADT with Eligard on 08/20/2020. In 09/2020, abiraterone/prednisone added.    Today (in 01/2021), pt doing well, PSA now undetectable, germline testing came back negative.      Plan  1. Continue with ADT. Eligard 45 mg last given on 08/20/2020, next due on 02/17/2021 or later. Plan confirmed  - Return next week for injection.  2. Continue Abiraterone 1000 mg po daily and prednisone 5 mg po daily. Plan confirmed  -- Check LFT's in 6 weeks  -- Monitor BP regularly, at each visit and with PCP.  3. Somatic tumor sequencing not feasible, due to not being able to locate the tumor.  4. Germline testing came back negative.  5. Return in 6 weeks or so. Lab visit in 3 weeks.    I personally spent 35 minutes face-to-face and non-face-to-face in the care of this patient, which includes all pre, intra, and post visit time on the date of service.        Reason for Visit  Follow up of prostate cancer    History of Present Illness:  Oncology History Overview Note   ??? In 08/2015, PSA 15.2. DRE cT1c  ??? In 09/2015, prostate biopsy 3+3=6, involving 8/12 biopsies.  ??? In 12/2015, brachytherapy with I-125 seed implant  ??? In 08/2017, PSA 3.45  ??? In 08/2018, PSA 5.92  ??? In 06/2020, PSA 17.6. Bone scan uptake in L 10, 11 rib, R iliac bone, corresponding to lytic lesions on CT.  ??? On 08/20/2020, started on ADT with Eligard 45 mg and a course of bicalutamide  ??? 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 Pt notes urinary frequency and nocturia. Pt also notes moderate hot flashes and fatigue. Otherwise, he has been doing well and notes no problems tolerating abiraterone and prednisone. No other issues noted.    Allergies:  Allergies   Allergen Reactions   ??? Aspirin Other (See Comments)     GI UPSET.  Stomach ulcer  Current Medications:    Current Outpatient Medications:   ???  abiraterone (ZYTIGA) 250 mg Tab tablet, Take 4 tablets (1,000 mg total) by mouth daily., Disp: 120 tablet, Rfl: 11  ???  acetaminophen (TYLENOL) 325 MG tablet, Take 325 mg by mouth every eight (8) hours as needed., Disp: , Rfl:   ???  calcium carbonate (OS-CAL) 1,500 mg (600 mg elem calcium) tablet, Take 1 tablet by mouth Two (2) times a day., Disp: , Rfl:   ???  cholecalciferol, vitamin D3-25 mcg, 1,000 unit,, (VITAMIN D3) 25 mcg (1,000 unit) capsule, Take 25 mcg by mouth daily., Disp: , Rfl:   ???  gabapentin (NEURONTIN) 100 MG capsule, Take 1 capsule (100 mg total) by mouth at bedtime., Disp: 30 capsule, Rfl: 3  ???  melatonin 3 mg Tab, Take 3 mg by mouth every evening., Disp: , Rfl:   ???  naproxen sodium (ALEVE) 220 MG tablet, Take 220 mg by mouth every six (6) hours as needed for pain., Disp: , Rfl:   ???  omeprazole (PRILOSEC) 20 MG capsule, Take 20 mg by mouth daily as needed., Disp: , Rfl:   ???  predniSONE (DELTASONE) 5 MG tablet, Take 1 tablet (5 mg total) by mouth daily., Disp: 30 tablet, Rfl: 11  ???  sildenafiL (VIAGRA) 50 MG tablet, Take 50 mg by mouth. , Disp: , Rfl:   ???  traMADoL (ULTRAM) 50 mg tablet, Take 1 tablet (50 mg total) by mouth every six (6) hours as needed for pain,severe (7-10) for up to 20 doses., Disp: 20 tablet, Rfl: 0    Past Medical History and Social History  Past Medical History:   Diagnosis Date   ??? Bone cancer (CMS-HCC)    ??? Peptic ulcer disease 1976   ??? Prostate cancer (CMS-HCC)    ??? Ulcerative colitis (CMS-HCC)       Past Surgical History:   Procedure Laterality Date   ??? BACK SURGERY  2005   ??? PR COLONOSCOPY FLX DX W/COLLJ SPEC WHEN PFRMD N/A 11/05/2020    Procedure: COLONOSCOPY, FLEXIBLE, PROXIMAL TO SPLENIC FLEXURE; DIAGNOSTIC, W/WO COLLECTION SPECIMEN BY BRUSH OR WASH;  Surgeon: Leon Bras, MD;  Location: [facility];  Service: Gastroenterology   ??? TOTAL HIP ARTHROPLASTY  2012        Social History     Occupational History   ??? Not on file   Tobacco Use   ??? Smoking status: Never Smoker   ??? Smokeless tobacco: Never Used   Vaping Use   ??? Vaping Use: Never used   Substance and Sexual Activity   ??? Alcohol use: Yes     Alcohol/week: 1.0 standard drink     Types: 1 Shots of liquor per week   ??? Drug use: Never   ??? Sexual activity: Not on file       Family History    Prostate Cancer Family History Assessment:  ??? History of cancer in children (yes/no; if yes, what type AND age of diagnosis): No  ??? History of cancer in siblings (yes/no; if yes, provide relation, type of cancer, AND age of diagnosis): Yes, brother with prostate cancer, age 47's  ??? History of cancer in parents (yes/no; if yes, please specify parent, type of cancer, AND age of diagnosis): Yes, father with prostate cancer, age 49's. mother with lung cancer, age 42  ??? History of cancer in aunts/uncles/grandparents (yes/no; if yes, provide relation, type of cancer, AND age of diagnosis): No  Review of Systems:  A comprehensive review of 10 systems was negative except for pertinent positives noted in HPI.    Physical Exam:    VITAL SIGNS:  BP 140/91  - Pulse 77  - Temp 36.7 ??C (98.1 ??F) (Oral)  - Resp 16  - Ht 170.2 cm (5' 7)  - Wt 89.9 kg (198 lb 3.2 oz)  - SpO2 98%  - BMI 31.04 kg/m??   ECOG Performance Status: 1  GENERAL: Well-developed, well-nourished patient in no acute distress.  HEAD: Normocephalic and atraumatic.  EYES: Conjunctivae are normal. No scleral icterus.  MOUTH/THROAT: Oropharynx is clear and moist.  No mucosal lesions.  NECK: Supple, no thyromegaly.  LYMPHATICS: No palpable cervical, supraclavicular, or axillary adenopathy.  CARDIOVASCULAR: Normal rate, regular rhythm and normal heart sounds.  Exam reveals no gallop and no friction rub.  No murmur heard.  PULMONARY/CHEST: Effort normal and breath sounds normal. No respiratory distress.  ABDOMINAL:  Soft. There is no distension. There is no tenderness. There is no rebound and no guarding.  MUSCULOSKELETAL: No clubbing, cyanosis, or lower extremity edema.  PSYCHIATRIC: Alert and oriented.  Normal mood and affect.  NEUROLOGIC: No focal motor deficit. Normal gait.  SKIN: Skin is warm, dry, and intact.      Results/Orders:    No visits with results within 2 Week(s) from this visit.   Latest known visit with results is:   Lab on 12/05/2020   Component Date Value Ref Range Status   ??? PSA 12/05/2020 0.05  0.00 - 4.00 ng/mL Final   ??? Potassium 12/05/2020 4.3  3.5 - 5.1 mmol/L Final   ??? Albumin 12/05/2020 4.0  3.4 - 5.0 g/dL Final   ??? Total Protein 12/05/2020 7.0  5.7 - 8.2 g/dL Final   ??? Total Bilirubin 12/05/2020 0.4  0.3 - 1.2 mg/dL Final   ??? Bilirubin, Direct 12/05/2020 0.10  0.00 - 0.30 mg/dL Final   ??? AST 16/09/9603 23  <=34 U/L Final   ??? ALT 12/05/2020 22  10 - 49 U/L Final   ??? Alkaline Phosphatase 12/05/2020 82  46 - 116 U/L Final   ??? DNA Test Name 12/05/2020 SEE COMMENT   Final    Custom Prostate Cancer Panel (ATM, BARD1, BRCA1, BRCA2, BRIP1, CHEK2, EPCAM, FANCA, HOXB13, MLH1, MSH2, MSH6, PALB2, PMS2, RAD51C, RAD51D, TP53) sent to [facility].     ??? Miscellaneous DNA Result 12/05/2020 SEE COMMENT   Final    Scanned report is available through the hyperlink below or in the Media Tab. Consultation from a molecular pathologist is available for provider, if desired. Patients should contact their provider for test result interpretation.         Lab Results   Component Value Date    PSA 0.05 12/05/2020    PSA 0.06 11/07/2020    PSA 0.59 10/03/2020    PSA 14.76 (H) 08/13/2020    PSA 17.62 (H) 07/16/2020             Orders placed or performed in visit on 02/13/21   ??? Clinic Appointment Request Physician           Molecular Pathology  Tumor mutation profiling  Unable to locate specimen    Germline testing  [facility] prostate custom panel 12/05/2021  Negative    Pathology  Gleason 3+3=6, 8/12 cores in 2016    Imaging results:  CT AP on 08/01/2020  -Fiducial markers within the prostate gland. Known malignancy is not well demonstrated on current exam.  ??  -  Few subcentimeter right external iliac nodes are identified. Attention is recommended on subsequent examinations  ??  - Hypoattenuating lesions of the pancreatic neck, indeterminate and may possibly reflect interdigitating fat versus IPMNs, too small to characterize. Nonemergent MRI/MRCP can better assess. This can also assess subcentimeter hepatic lesions described above.  ??  -Osseous findings are better evaluated on concurrent bone scan    Bone scan 08/01/2020  Abnormal focal increase in radiotracer uptake in the left 10th and 11th ribs and a very subtle increase in radiotracer uptake in the medial aspect of the right iliac bone, all corresponding to lytic changes on recent CT abdomen pelvis. These are worrisome for metastatic osseous lesions. the right iliac bone, all corresponding to lytic changes on recent CT abdomen pelvis. These are worrisome for metastatic osseous lesions.

## 2021-02-14 NOTE — Unmapped (Signed)
Lab Results   Component Value Date    PSA 0.05 12/05/2020    PSA 0.06 11/07/2020    PSA 0.59 10/03/2020    PSA 14.76 (H) 08/13/2020    PSA 17.62 (H) 07/16/2020   Continue with current medication abiraterone and prednisone.  Hormone shot next week.    Lab check in 6 weeks, then return in 3 months, in early June.    Please call [phone] to reach my nurse navigator Mauricia Area for any issues.    For emergencies on Nights, Weekends and Holidays  Call [phone] and ask for the hematology/oncology on call.    Griffin Basil, MD, PhD  Associate Professor of Medicine  Division of Hematology-Oncology    [facility]  Genitourinary Oncology Clinic  Nurse Navigator: Mauricia Area  Fax: [phone]

## 2021-02-20 ENCOUNTER — Encounter: Admit: 2021-02-20 | Discharge: 2021-02-20 | Payer: PRIVATE HEALTH INSURANCE

## 2021-02-20 DIAGNOSIS — C61 Malignant neoplasm of prostate: Principal | ICD-10-CM

## 2021-02-20 MED ADMIN — leuprolide (6 month) (ELIGARD) injection 45 mg: 45 mg | SUBCUTANEOUS | @ 19:00:00 | Stop: 2021-02-20

## 2021-02-20 NOTE — Unmapped (Signed)
Patient received Eligard 45mg   SQ in Injection site: right lower abdomen. Band aid and guaze applied. Patient tolerated injection without complications and ambulated to checkout without any assistance

## 2021-02-26 MED ORDER — CELECOXIB 200 MG CAPSULE
ORAL_CAPSULE | Freq: Two times a day (BID) | ORAL | 1 refills | 30 days | Status: CP
Start: 2021-02-26 — End: 2021-03-28

## 2021-02-27 NOTE — Unmapped (Signed)
Leon Gonzalez reports he has 7 day supply of medication on hand and wishes to wait for [employer] assistance approval.  He declined refill.

## 2021-03-13 NOTE — Unmapped (Signed)
Medication was approved through manufacturer LM for pt to make sure they are aware.

## 2021-03-13 NOTE — Unmapped (Signed)
This patient has been disenrolled from the [facility] specialty pharmacy services due to enrollment in a manufacturer assistance program that sends medicine directly to the patient.    Medication (Brand/Generic): Zytiga 250mg  tablet  Coverage Dates: 03/06/2021 through 03/06/2022  ??  The requested prescription above will be made available through the manufacturer's free drug program and will be shipped directly to the patient via the manufacturer's contracted specialty pharmacy.  [facility]  ??  A representative from the manufacturer program will reach out to the patient to schedule a delivery.  Patient can follow up with manufacturer if needed via phone# @ [phone].    Kermit Balo  [facility] Specialty Pharmacist

## 2021-03-29 ENCOUNTER — Encounter: Admit: 2021-03-29 | Discharge: 2021-03-29 | Payer: PRIVATE HEALTH INSURANCE

## 2021-04-03 ENCOUNTER — Encounter: Admit: 2021-04-03 | Discharge: 2021-04-04 | Payer: PRIVATE HEALTH INSURANCE

## 2021-04-22 ENCOUNTER — Ambulatory Visit
Admit: 2021-04-22 | Discharge: 2021-04-23 | Payer: PRIVATE HEALTH INSURANCE | Attending: Internal Medicine | Primary: Internal Medicine

## 2021-04-22 DIAGNOSIS — M1612 Unilateral primary osteoarthritis, left hip: Principal | ICD-10-CM

## 2021-04-29 MED ORDER — CELECOXIB 200 MG CAPSULE
ORAL_CAPSULE | Freq: Two times a day (BID) | ORAL | 1 refills | 30.00000 days | Status: CP
Start: 2021-04-29 — End: 2021-05-29

## 2021-05-29 ENCOUNTER — Encounter
Admit: 2021-05-29 | Discharge: 2021-05-30 | Payer: PRIVATE HEALTH INSURANCE | Attending: Hematology & Oncology | Primary: Hematology & Oncology

## 2021-05-29 ENCOUNTER — Encounter: Admit: 2021-05-29 | Discharge: 2021-05-30 | Payer: PRIVATE HEALTH INSURANCE

## 2021-05-29 DIAGNOSIS — C61 Malignant neoplasm of prostate: Principal | ICD-10-CM

## 2021-05-29 DIAGNOSIS — C7951 Secondary malignant neoplasm of bone: Principal | ICD-10-CM

## 2021-06-02 ENCOUNTER — Encounter: Admit: 2021-06-02 | Discharge: 2021-06-03 | Payer: PRIVATE HEALTH INSURANCE

## 2021-06-02 DIAGNOSIS — K219 Gastro-esophageal reflux disease without esophagitis: Principal | ICD-10-CM

## 2021-06-02 DIAGNOSIS — N289 Disorder of kidney and ureter, unspecified: Principal | ICD-10-CM

## 2021-06-02 DIAGNOSIS — M25551 Pain in right hip: Principal | ICD-10-CM

## 2021-06-02 DIAGNOSIS — G8929 Other chronic pain: Principal | ICD-10-CM

## 2021-06-02 DIAGNOSIS — M1990 Unspecified osteoarthritis, unspecified site: Principal | ICD-10-CM

## 2021-06-02 DIAGNOSIS — C7951 Secondary malignant neoplasm of bone: Principal | ICD-10-CM

## 2021-06-02 DIAGNOSIS — K5190 Ulcerative colitis, unspecified, without complications: Principal | ICD-10-CM

## 2021-06-02 DIAGNOSIS — F5101 Primary insomnia: Principal | ICD-10-CM

## 2021-06-02 DIAGNOSIS — M25552 Pain in left hip: Principal | ICD-10-CM

## 2021-06-02 MED ORDER — OMEPRAZOLE 20 MG CAPSULE,DELAYED RELEASE
ORAL_CAPSULE | Freq: Every day | ORAL | 2 refills | 90 days | Status: CP | PRN
Start: 2021-06-02 — End: ?

## 2021-06-02 MED ORDER — CELECOXIB 200 MG CAPSULE
ORAL_CAPSULE | 3 refills | 0 days | Status: CP
Start: 2021-06-02 — End: ?

## 2021-06-06 ENCOUNTER — Encounter
Admit: 2021-06-06 | Discharge: 2021-06-07 | Payer: PRIVATE HEALTH INSURANCE | Attending: Physician Assistant | Primary: Physician Assistant

## 2021-06-17 ENCOUNTER — Encounter: Admit: 2021-06-17 | Discharge: 2021-06-18 | Payer: PRIVATE HEALTH INSURANCE

## 2021-07-10 MED ORDER — MUPIROCIN 2 % TOPICAL OINTMENT
0 refills | 0 days
Start: 2021-07-10 — End: ?

## 2021-07-14 DIAGNOSIS — Z01818 Encounter for other preprocedural examination: Principal | ICD-10-CM

## 2021-07-14 MED FILL — MUPIROCIN 2 % TOPICAL OINTMENT: 5 days supply | Qty: 22 | Fill #0

## 2021-07-21 ENCOUNTER — Encounter: Admit: 2021-07-21 | Discharge: 2021-07-22 | Payer: PRIVATE HEALTH INSURANCE

## 2021-07-21 DIAGNOSIS — Z01818 Encounter for other preprocedural examination: Principal | ICD-10-CM

## 2021-07-21 DIAGNOSIS — N289 Disorder of kidney and ureter, unspecified: Principal | ICD-10-CM

## 2021-07-21 DIAGNOSIS — M25552 Pain in left hip: Principal | ICD-10-CM

## 2021-07-21 DIAGNOSIS — G8929 Other chronic pain: Principal | ICD-10-CM

## 2021-07-24 ENCOUNTER — Encounter: Admit: 2021-07-24 | Payer: PRIVATE HEALTH INSURANCE

## 2021-07-24 MED ORDER — ONDANSETRON 4 MG DISINTEGRATING TABLET
ORAL_TABLET | 0 refills | 0 days
Start: 2021-07-24 — End: ?

## 2021-07-24 MED ORDER — DOCUSATE SODIUM 100 MG CAPSULE
ORAL_CAPSULE | Freq: Two times a day (BID) | ORAL | 0 refills | 30 days
Start: 2021-07-24 — End: ?

## 2021-07-24 MED ORDER — ALPRAZOLAM 0.25 MG TABLET
ORAL_TABLET | 0 refills | 0 days
Start: 2021-07-24 — End: ?

## 2021-07-24 MED ORDER — OXYCODONE 5 MG TABLET
ORAL_TABLET | 0 refills | 0 days
Start: 2021-07-24 — End: ?

## 2021-07-24 MED ORDER — ASPIRIN 81 MG TABLET,DELAYED RELEASE
ORAL_TABLET | 0 refills | 0 days
Start: 2021-07-24 — End: ?

## 2021-07-24 MED FILL — ONDANSETRON 4 MG DISINTEGRATING TABLET: 6 days supply | Qty: 20 | Fill #0

## 2021-07-24 MED FILL — ALPRAZOLAM 0.25 MG TABLET: 8 days supply | Qty: 8 | Fill #0

## 2021-07-24 MED FILL — ASPIRIN 81 MG TABLET,DELAYED RELEASE: 30 days supply | Qty: 60 | Fill #0

## 2021-07-24 MED FILL — DOCUSATE SODIUM 100 MG CAPSULE: ORAL | 30 days supply | Qty: 60 | Fill #0

## 2021-07-24 MED FILL — OXYCODONE 5 MG TABLET: 7 days supply | Qty: 30 | Fill #0

## 2021-09-11 ENCOUNTER — Ambulatory Visit: Admit: 2021-09-11 | Discharge: 2021-09-12 | Payer: PRIVATE HEALTH INSURANCE

## 2021-09-25 ENCOUNTER — Institutional Professional Consult (permissible substitution): Admit: 2021-09-25 | Discharge: 2021-09-26 | Payer: PRIVATE HEALTH INSURANCE

## 2021-09-25 ENCOUNTER — Other Ambulatory Visit: Admit: 2021-09-25 | Discharge: 2021-09-26 | Payer: PRIVATE HEALTH INSURANCE

## 2021-09-25 ENCOUNTER — Ambulatory Visit: Admit: 2021-09-25 | Discharge: 2021-09-26 | Payer: PRIVATE HEALTH INSURANCE | Attending: Family | Primary: Family

## 2021-09-25 DIAGNOSIS — C61 Malignant neoplasm of prostate: Principal | ICD-10-CM

## 2021-09-25 MED ORDER — DEXAMETHASONE 2 MG TABLET
ORAL_TABLET | Freq: Every day | ORAL | 0 refills | 14 days | Status: CP
Start: 2021-09-25 — End: 2022-09-25

## 2021-09-25 MED ORDER — VENLAFAXINE ER 37.5 MG CAPSULE,EXTENDED RELEASE 24 HR
ORAL_CAPSULE | Freq: Every day | ORAL | 6 refills | 30 days | Status: CP
Start: 2021-09-25 — End: ?

## 2021-10-09 ENCOUNTER — Ambulatory Visit: Admit: 2021-10-09 | Discharge: 2021-10-10 | Payer: PRIVATE HEALTH INSURANCE

## 2021-10-09 DIAGNOSIS — Z0100 Encounter for examination of eyes and vision without abnormal findings: Principal | ICD-10-CM

## 2021-10-09 DIAGNOSIS — Z0000 Encounter for general adult medical examination without abnormal findings: Principal | ICD-10-CM

## 2021-10-09 MED ORDER — OMEPRAZOLE 20 MG CAPSULE,DELAYED RELEASE
ORAL_CAPSULE | Freq: Every day | ORAL | 2 refills | 90 days | Status: CP | PRN
Start: 2021-10-09 — End: ?

## 2021-10-15 MED ORDER — METHYLPHENIDATE 5 MG TABLET
ORAL_TABLET | Freq: Every day | ORAL | 0 refills | 30 days | Status: CP
Start: 2021-10-15 — End: 2022-10-15

## 2021-10-19 MED ORDER — VENLAFAXINE ER 37.5 MG CAPSULE,EXTENDED RELEASE 24 HR
ORAL_CAPSULE | 3 refills | 0 days
Start: 2021-10-19 — End: ?

## 2021-10-22 MED ORDER — VENLAFAXINE ER 37.5 MG CAPSULE,EXTENDED RELEASE 24 HR
ORAL_CAPSULE | 3 refills | 0 days | Status: CP
Start: 2021-10-22 — End: ?

## 2021-12-25 ENCOUNTER — Ambulatory Visit: Admit: 2021-12-25 | Discharge: 2021-12-25 | Payer: PRIVATE HEALTH INSURANCE | Attending: Family | Primary: Family

## 2021-12-25 ENCOUNTER — Other Ambulatory Visit: Admit: 2021-12-25 | Discharge: 2021-12-25 | Payer: PRIVATE HEALTH INSURANCE

## 2021-12-25 DIAGNOSIS — C61 Malignant neoplasm of prostate: Principal | ICD-10-CM

## 2021-12-31 ENCOUNTER — Ambulatory Visit: Admit: 2021-12-31 | Discharge: 2022-01-02 | Payer: PRIVATE HEALTH INSURANCE

## 2022-01-12 ENCOUNTER — Ambulatory Visit: Admit: 2022-01-12 | Discharge: 2022-01-13 | Payer: PRIVATE HEALTH INSURANCE

## 2022-01-12 DIAGNOSIS — G8929 Other chronic pain: Principal | ICD-10-CM

## 2022-01-12 DIAGNOSIS — N289 Disorder of kidney and ureter, unspecified: Principal | ICD-10-CM

## 2022-01-12 DIAGNOSIS — Z01810 Encounter for preprocedural cardiovascular examination: Principal | ICD-10-CM

## 2022-01-12 DIAGNOSIS — M25551 Pain in right hip: Principal | ICD-10-CM

## 2022-01-12 DIAGNOSIS — C61 Malignant neoplasm of prostate: Principal | ICD-10-CM

## 2022-01-13 ENCOUNTER — Ambulatory Visit: Admit: 2022-01-13 | Discharge: 2022-01-13 | Payer: PRIVATE HEALTH INSURANCE

## 2022-01-13 ENCOUNTER — Encounter
Admit: 2022-01-13 | Discharge: 2022-01-13 | Payer: PRIVATE HEALTH INSURANCE | Attending: Anesthesiology | Primary: Anesthesiology

## 2022-01-14 DIAGNOSIS — T84030D Mechanical loosening of internal right hip prosthetic joint, subsequent encounter: Principal | ICD-10-CM

## 2022-02-02 ENCOUNTER — Encounter
Admit: 2022-02-02 | Discharge: 2022-02-03 | Disposition: A | Discharge status: Home / Self Care | Payer: PRIVATE HEALTH INSURANCE | Attending: Certified Registered"

## 2022-02-02 ENCOUNTER — Encounter
Admit: 2022-02-02 | Discharge: 2022-02-03 | Disposition: A | Discharge status: Home / Self Care | Payer: PRIVATE HEALTH INSURANCE | Attending: Anesthesiology

## 2022-02-02 ENCOUNTER — Ambulatory Visit
Admit: 2022-02-02 | Discharge: 2022-02-03 | Disposition: A | Discharge status: Home / Self Care | Payer: PRIVATE HEALTH INSURANCE

## 2022-02-02 MED ORDER — ONDANSETRON HCL 4 MG TABLET
ORAL_TABLET | Freq: Three times a day (TID) | ORAL | 0 refills | 7 days | Status: CP | PRN
Start: 2022-02-02 — End: 2022-03-04
  Filled 2022-02-03: qty 20, 7d supply, fill #0

## 2022-02-02 MED ORDER — ALPRAZOLAM 0.25 MG TABLET
ORAL_TABLET | Freq: Three times a day (TID) | ORAL | 0 refills | 3 days | Status: CP | PRN
Start: 2022-02-02 — End: 2022-02-07
  Filled 2022-02-03: qty 8, 3d supply, fill #0

## 2022-03-24 DIAGNOSIS — C61 Malignant neoplasm of prostate: Principal | ICD-10-CM

## 2022-09-20 DIAGNOSIS — C61 Malignant neoplasm of prostate: Principal | ICD-10-CM

## 2023-11-10 ENCOUNTER — Ambulatory Visit: Admit: 2023-11-10 | Discharge: 2023-11-11 | Payer: BLUE CROSS/BLUE SHIELD | Attending: Family | Primary: Family

## 2023-11-10 DIAGNOSIS — Z8546 Personal history of malignant neoplasm of prostate: Principal | ICD-10-CM

## 2023-11-10 DIAGNOSIS — Z0001 Encounter for general adult medical examination with abnormal findings: Principal | ICD-10-CM

## 2023-12-01 ENCOUNTER — Institutional Professional Consult (permissible substitution): Admit: 2023-12-01 | Discharge: 2023-12-02 | Payer: BLUE CROSS/BLUE SHIELD | Attending: Family | Primary: Family

## 2023-12-01 DIAGNOSIS — R972 Elevated prostate specific antigen [PSA]: Principal | ICD-10-CM

## 2023-12-01 DIAGNOSIS — C61 Malignant neoplasm of prostate: Principal | ICD-10-CM

## 2023-12-01 DIAGNOSIS — C7951 Secondary malignant neoplasm of bone: Principal | ICD-10-CM
